# Patient Record
Sex: Female | Born: 1955
Health system: Southern US, Community
[De-identification: ages and names within clinical notes are randomized; demographics above are authoritative.]

## PROBLEM LIST (undated history)

## (undated) DIAGNOSIS — Z8619 Personal history of other infectious and parasitic diseases: Secondary | ICD-10-CM

## (undated) DIAGNOSIS — M8580 Other specified disorders of bone density and structure, unspecified site: Secondary | ICD-10-CM

## (undated) DIAGNOSIS — R05 Cough: Secondary | ICD-10-CM

## (undated) DIAGNOSIS — I499 Cardiac arrhythmia, unspecified: Secondary | ICD-10-CM

## (undated) DIAGNOSIS — R053 Chronic cough: Secondary | ICD-10-CM

## (undated) DIAGNOSIS — R0602 Shortness of breath: Secondary | ICD-10-CM

## (undated) DIAGNOSIS — I10 Essential (primary) hypertension: Secondary | ICD-10-CM

## (undated) DIAGNOSIS — Z1211 Encounter for screening for malignant neoplasm of colon: Secondary | ICD-10-CM

## (undated) HISTORY — DX: Cough: R05

## (undated) HISTORY — PX: OOPHORECTOMY: SHX86

## (undated) HISTORY — DX: Other specified disorders of bone density and structure, unspecified site: M85.80

## (undated) HISTORY — DX: Shortness of breath: R06.02

## (undated) HISTORY — DX: Personal history of other infectious and parasitic diseases: Z86.19

## (undated) HISTORY — DX: Essential (primary) hypertension: I10

## (undated) HISTORY — DX: Cardiac arrhythmia, unspecified: I49.9

## (undated) HISTORY — PX: FOOT NEUROMA SURGERY: SHX646

## (undated) HISTORY — PX: CO2 LASER APPLICATION: SHX5778

## (undated) HISTORY — PX: TUBAL LIGATION: SHX77

## (undated) HISTORY — DX: Chronic cough: R05.3

## (undated) HISTORY — DX: Encounter for screening for malignant neoplasm of colon: Z12.11

## (undated) HISTORY — PX: COLPOSCOPY: SHX161

---
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
## 1981-05-11 HISTORY — PX: OTHER SURGICAL HISTORY: SHX169

## 1991-05-12 HISTORY — PX: BREAST BIOPSY: SHX20

## 2002-05-11 HISTORY — PX: CHOLECYSTECTOMY: SHX55

## 2010-06-06 ENCOUNTER — Ambulatory Visit
Admission: RE | Admit: 2010-06-06 | Discharge: 2010-06-06 | Payer: Self-pay | Source: Home / Self Care | Attending: Family Medicine | Admitting: Family Medicine

## 2010-06-06 ENCOUNTER — Other Ambulatory Visit: Payer: Self-pay | Admitting: Family Medicine

## 2010-06-06 ENCOUNTER — Encounter (INDEPENDENT_AMBULATORY_CARE_PROVIDER_SITE_OTHER): Payer: Self-pay | Admitting: *Deleted

## 2010-06-06 DIAGNOSIS — Z1239 Encounter for other screening for malignant neoplasm of breast: Secondary | ICD-10-CM

## 2010-06-06 DIAGNOSIS — I499 Cardiac arrhythmia, unspecified: Secondary | ICD-10-CM | POA: Insufficient documentation

## 2010-06-06 DIAGNOSIS — I10 Essential (primary) hypertension: Secondary | ICD-10-CM | POA: Insufficient documentation

## 2010-06-12 ENCOUNTER — Ambulatory Visit
Admission: RE | Admit: 2010-06-12 | Discharge: 2010-06-12 | Disposition: A | Discharge status: Home / Self Care | Payer: Self-pay | Source: Ambulatory Visit | Attending: Family Medicine | Admitting: Family Medicine

## 2010-06-12 DIAGNOSIS — Z1239 Encounter for other screening for malignant neoplasm of breast: Secondary | ICD-10-CM

## 2010-06-12 NOTE — Letter (Signed)
Summary: Pre Visit Letter Revised  [facility]  [address] [place], [place] [address]   Phone: [phone]  Fax: [phone]        06/06/2010 MRN: [id number] Pamela Reynolds [address] [place], [place]  [address]             Procedure Date:  July 11, 2010   dir col-Dr Justin Mend to the Gastroenterology Division at [facility].    You are scheduled to see a nurse for your pre-procedure visit on June 27, 2010 at 11:00am on the 3rd floor at [facility], [address]  We ask that you try to arrive at our office 15 minutes prior to your appointment time to allow for check-in.  Please take a minute to review the attached form.  If you answer "Yes" to one or more of the questions on the first page, we ask that you call the person listed at your earliest opportunity.  If you answer "No" to all of the questions, please complete the rest of the form and bring it to your appointment.    Your nurse visit will consist of discussing your medical and surgical history, your immediate family medical history, and your medications.   If you are unable to list all of your medications on the form, please bring the medication bottles to your appointment and we will list them.  We will need to be aware of both prescribed and over the counter drugs.  We will need to know exact dosage information as well.    Please be prepared to read and sign documents such as consent forms, a financial agreement, and acknowledgement forms.  If necessary, and with your consent, a friend or relative is welcome to sit-in on the nurse visit with you.  Please bring your insurance card so that we may make a copy of it.  If your insurance requires a referral to see a specialist, please bring your referral form from your primary care physician.  No co-pay is required for this nurse visit.     If you cannot keep your appointment, please call [phone] to cancel or reschedule prior to your  appointment date.  This allows [place] the opportunity to schedule an appointment for another patient in need of care.    Thank you for choosing [facility] for your medical needs.  We appreciate the opportunity to care for you.  Please visit [place] at our website  to learn more about our practice.  Sincerely, The Gastroenterology Division

## 2010-06-12 NOTE — Assessment & Plan Note (Signed)
Summary: NEW PT TO BE ESTABLISHED/JRR   Vital Signs:  Patient profile:   55 year old female Height:      70 inches Weight:      173.75 pounds BMI:     25.02 Temp:     98.3 degrees F oral Pulse rate:   68 / minute Pulse rhythm:   regular BP sitting:   176 / 90  (left arm) Cuff size:   regular  Vitals Entered By: Delilah Shan CMA Bich Mchaney Dull) (June 06, 2010 9:49 AM) CC: New Patient to Establish   History of Present Illness: NP.  Needs to est care.   Hypertension:      Using medication without problems or lightheadedness: yes Chest pain with exertion:no Edema:no Short of breath:occ, brief, at rest, not with exertion, long standing Other issues: no  Preventive Screening-Counseling & Management  Alcohol-Tobacco     Smoking Status: never  Caffeine-Diet-Exercise     Does Patient Exercise: no      Drug Use:  no.    Allergies (verified): No Known Drug Allergies  Past History:  Past Medical History: CHICKENPOX, HX OF (ICD-V15.9) CARDIAC ARRHYTHMIA (ICD-427.9) in distant past, s/p work up, was told this was benign HYPERTENSION (ICD-401.9) SCREENING, COLON CANCER (ICD-V76.51) OTHER SCREENING MAMMOGRAM (ICD-V76.12) H/o persistent cough with URI, prev tx'd with hycodan  Past Surgical History: 1973  Ovarian Cysts  1973  Appendectomy 1979   and 1980  - Childbirth, vaginal delivery, no complications 1993   Breast Biopsy 2002   Gallbladder 1983   BTL 2004   Cholecystectomy H/o R foot surgery for Morton's neuroma  Family History: Reviewed history and no changes required. Family History of Arthritis, grandparents Family History Hypertension, parents, grandparents Family History of Prostate CA, grandparents Family History of Heart Disease, grandparents F dead, MI at 39 M alive, healthy, HTN in 78s  Social History: Reviewed history and no changes required. Occupation:  Armed forces operational officer, in [employer] Education:  [employer] Never Smoked Alcohol use-yes, very  minimal (husband with h/o etoh abuse) Drug use-no Regular exercise-no From [place] Married 2000, second marriage.  2 grown children, 2 grandkidsOccupation:  employed Smoking Status:  never Drug Use:  no Does Patient Exercise:  no  Review of Systems       See HPI.  Otherwise negative.    Physical Exam  General:  GEN: nad, alert and oriented HEENT: mucous membranes moist NECK: supple w/o LA CV: rrr.  no murmur PULM: ctab, no inc wob ABD: soft, +bs EXT: no edema SKIN: no acute rash, likley lipoma noted on R posterior shoulder, not tender to palpation    Impression & Recommendations:  Problem # 1:  HYPERTENSION (ICD-401.9) No change in meds.  Return for labs and she'll check BP at home.  Unremarkable exam and okay for outpatient follow up. She agrees.  Her updated medication list for this problem includes:    Lisinopril 10 Mg Tabs (Lisinopril) .Marland Kitchen... Take 1 tablet by mouth once a day    Metoprolol Tartrate 50 Mg Tabs (Metoprolol tartrate) .Marland Kitchen... Take 1 tablet by mouth two times a day    Hydrochlorothiazide 25 Mg Tabs (Hydrochlorothiazide) .Marland Kitchen... Take 1 tablet by mouth once a day  Problem # 2:  OTHER SCREENING MAMMOGRAM (ICD-V76.12) Refer.  due.   Return for pap.  Orders: Radiology Referral (Radiology)  Problem # 3:  SCREENING, COLON CANCER (ICD-V76.51) refer, due.  D/w patient along with mammogram .  Orders: Gastroenterology Referral (GI)  Complete Medication List: 1)  Lisinopril 10 Mg Tabs (Lisinopril) .... Take 1 tablet by mouth once a day 2)  Metoprolol Tartrate 50 Mg Tabs (Metoprolol tartrate) .... Take 1 tablet by mouth two times a day 3)  Hydrochlorothiazide 25 Mg Tabs (Hydrochlorothiazide) .... Take 1 tablet by mouth once a day  Patient Instructions: 1)  Glad to see you today.  2)  Come back for fasting labs.  cmet/lipid 401.1 3)  Check your pressure at home and let me know about the readings.  You can calibrate your cuff here if needed. 4)  See Shirlee Limerick about your  referral before your leave today.   5)  Schedule a OV in about 1 month for pap.  6)  Take care.     Orders Added: 1)  New Patient Level III [99203] 2)  Gastroenterology Referral [GI] 3)  Radiology Referral [Radiology]    Current Allergies (reviewed today): No known allergies

## 2010-06-13 ENCOUNTER — Ambulatory Visit: Payer: 59

## 2010-06-13 ENCOUNTER — Encounter: Payer: Self-pay | Admitting: Family Medicine

## 2010-06-13 ENCOUNTER — Encounter (INDEPENDENT_AMBULATORY_CARE_PROVIDER_SITE_OTHER): Payer: Self-pay | Admitting: *Deleted

## 2010-06-13 ENCOUNTER — Other Ambulatory Visit (INDEPENDENT_AMBULATORY_CARE_PROVIDER_SITE_OTHER): Payer: 59

## 2010-06-13 ENCOUNTER — Other Ambulatory Visit: Payer: Self-pay | Admitting: Family Medicine

## 2010-06-13 DIAGNOSIS — I10 Essential (primary) hypertension: Secondary | ICD-10-CM

## 2010-06-13 LAB — LIPID PANEL
LDL Cholesterol: 119 mg/dL — ABNORMAL HIGH (ref 0–99)
Total CHOL/HDL Ratio: 4
Triglycerides: 61 mg/dL (ref 0.0–149.0)

## 2010-06-13 LAB — HEPATIC FUNCTION PANEL
ALT: 16 U/L (ref 0–35)
AST: 23 U/L (ref 0–37)
Bilirubin, Direct: 0.1 mg/dL (ref 0.0–0.3)
Total Protein: 6.6 g/dL (ref 6.0–8.3)

## 2010-06-13 LAB — BASIC METABOLIC PANEL
CO2: 31 mEq/L (ref 19–32)
Chloride: 102 mEq/L (ref 96–112)
Creatinine, Ser: 0.7 mg/dL (ref 0.4–1.2)
Potassium: 3.4 mEq/L — ABNORMAL LOW (ref 3.5–5.1)

## 2010-06-17 ENCOUNTER — Encounter (INDEPENDENT_AMBULATORY_CARE_PROVIDER_SITE_OTHER): Payer: Self-pay | Admitting: *Deleted

## 2010-06-18 NOTE — Assessment & Plan Note (Signed)
Summary: Blood pressure check  Nurse Visit   Vital Signs:  Patient profile:   55 year old female Height:      70 inches Weight:      173.75 pounds Pulse rate:   60 / minute Pulse rhythm:   regular BP sitting:   170 / 90  (left arm) Cuff size:   regular  Vitals Entered By: Linde Gillis CMA Deward Sebek Dull) (June 13, 2010 10:25 AM) CC: blood pressure check  190/103 on her automatic cuff 155/88 Tuesday 161/86 Wednesday AM, 192/83 PM 187/98 Thursday 169/92 Friday  Patient's blood pressure medication has not changed and she does not have a follow up appt.  Please call her on her cell number if any changes at [phone].  Linde Gillis CMA Jamarques Pinedo Dull)  June 13, 2010 10:35 AM   Please have the patient increase her lisinopril to 2 tabs a day and then keep checking her BP once a day.  Call me with an update next week on BP.  Note that it will take a days for the higher dose of the lisinopril to work.  I'll notify her about the labs when they are resulted.  Thanks. Wwe can determine follow up after I get the next BP readings.   Patient Advised. Lugene Fuquay CMA (AAMA)  June 13, 2010 12:15 PM        Allergies: No Known Drug Allergies

## 2010-06-20 ENCOUNTER — Encounter: Payer: Self-pay | Admitting: Family Medicine

## 2010-06-20 ENCOUNTER — Telehealth: Payer: Self-pay | Admitting: Family Medicine

## 2010-06-20 ENCOUNTER — Ambulatory Visit: Payer: 59

## 2010-06-20 DIAGNOSIS — I10 Essential (primary) hypertension: Secondary | ICD-10-CM

## 2010-06-26 ENCOUNTER — Encounter (INDEPENDENT_AMBULATORY_CARE_PROVIDER_SITE_OTHER): Payer: Self-pay | Admitting: *Deleted

## 2010-06-26 NOTE — Letter (Signed)
Summary: Results Follow up Letter  [place] [facility]  [address] [place], [place] [address]   Phone: [phone]  Fax: [phone]    06/17/2010 MRN: [id number]    Pamela Reynolds [address] [place]  [address]  [place]    Dear [name],  The following are the results of your recent test(s):  Test         Result    Pap Smear:        Normal _____  Not Normal _____ Comments: ______________________________________________________ Cholesterol: LDL(Bad cholesterol):         Your goal is less than:         HDL (Good cholesterol):       Your goal is more than: Comments:  ______________________________________________________ Mammogram:        Normal __X___  Not Normal _____ Comments:  Yearly follow up is recommended.   ___________________________________________________________________ Hemoccult:        Normal _____  Not normal _______ Comments:    _____________________________________________________________________ Other Tests:    We routinely do not discuss normal results over the telephone.  If you desire a copy of the results, or you have any questions about this information we can discuss them at your next office visit.   Sincerely,    Dwana Curd. Para March, M.D.  [facility]

## 2010-06-26 NOTE — Assessment & Plan Note (Signed)
Summary: BP check  Nurse Visit   Vital Signs:  Patient profile:   55 year old female Height:      70 inches Weight:      173.75 pounds BMI:     25.02 BP sitting:   188 / 102  (left arm) Cuff size:   regular  Vitals Entered By: Delilah Shan CMA Shalik Sanfilippo Dull) (June 20, 2010 12:43 PM) CC: BP check   Allergies: No Known Drug Allergies  Current Allergies (reviewed today): No known allergies  Patient says she has been getting similar readings at home with her machine.  She did increase the Lisinopril to 2 tablets a day and has developed a cough, worse at night.  She is not sure if it is related to the increased Lisinopril.  Please see phone note.  See following note.  Crawford Givens MD  June 20, 2010 1:02 PM

## 2010-06-26 NOTE — Progress Notes (Signed)
Summary: ? cough med  Phone Note Call from Patient Call back at Home Phone [phone]   Caller: Patient Call For: Crawford Givens MD Summary of Call: Patient was in for a BP check.  She thinks she is starting to take the URI stuff that so many folks have right now.  She says that you have the name of the cough med that she has taken in the past in her chart and she would like that called in to her pharmacy.  [facility]  [phone]  Initial call taken by: Delilah Shan CMA Marx Doig Dull),  June 20, 2010 12:48 PM  Follow-up for Phone Call        have her continue the lisinopril at her current dose and start amlodipine 10mg  by mouth once daily.  Keep checking her BP and get her on my schedule for an OV Monday or Tuesday.   please call in the following: amlodipine 10mg  by mouth once daily for blood pressure, #30, 3rf and HYDROCODONE-HOMATROPINE 5-1.5 MG/5ML SYRP, 5 ml by mouth qid as needed for cough with sedation caution.  8oz, no rf.  thanks.  her BP is up, but this is not sig different than prev and as long as she has no CP/sob, then it's okay to continue with the plan above.  If CP/sob, to ER.  Follow-up by: Crawford Givens MD,  June 20, 2010 1:07 PM  Additional Follow-up for Phone Call Additional follow up Details #1::        Patient Advised.  Medication phoned to pharmacy.  Additional Follow-up by: Delilah Shan CMA (AAMA),  June 20, 2010 2:38 PM    New/Updated Medications: AMLODIPINE BESYLATE 10 MG TABS (AMLODIPINE BESYLATE) 1 by mouth once daily HYDROCODONE-HOMATROPINE 5-1.5 MG/5ML SYRP (HYDROCODONE-HOMATROPINE) 5ml by mouth qid as needed for cough, sedation caution Prescriptions: HYDROCODONE-HOMATROPINE 5-1.5 MG/5ML SYRP (HYDROCODONE-HOMATROPINE) 5ml by mouth qid as needed for cough, sedation caution  #8oz x 0   Entered and Authorized by:   Crawford Givens MD   Signed by:   Crawford Givens MD on 06/20/2010   Method used:   Historical   RxID:   [id number] AMLODIPINE BESYLATE  10 MG TABS (AMLODIPINE BESYLATE) 1 by mouth once daily  #30 x 3   Entered and Authorized by:   Crawford Givens MD   Signed by:   Crawford Givens MD on 06/20/2010   Method used:   Historical   RxID:   [id number]

## 2010-06-27 ENCOUNTER — Encounter: Payer: Self-pay | Admitting: Internal Medicine

## 2010-06-27 ENCOUNTER — Ambulatory Visit (INDEPENDENT_AMBULATORY_CARE_PROVIDER_SITE_OTHER): Payer: 59 | Admitting: Family Medicine

## 2010-06-27 ENCOUNTER — Encounter: Payer: Self-pay | Admitting: Family Medicine

## 2010-06-27 DIAGNOSIS — I10 Essential (primary) hypertension: Secondary | ICD-10-CM

## 2010-07-02 ENCOUNTER — Telehealth: Payer: Self-pay | Admitting: Family Medicine

## 2010-07-02 NOTE — Miscellaneous (Signed)
Summary: lec pv/kco  Clinical Lists Changes  Medications: Added new medication of MOVIPREP 100 GM  SOLR (PEG-KCL-NACL-NASULF-NA ASC-C) As per prep instructions. - Signed Rx of MOVIPREP 100 GM  SOLR (PEG-KCL-NACL-NASULF-NA ASC-C) As per prep instructions.;  #1 x 0;  Signed;  Entered by: Durwin Glaze RN;  Authorized by: Hilarie Fredrickson MD;  Method used: Print then Give to Patient Observations: Added new observation of NKA: T (06/27/2010 9:16)    Prescriptions: MOVIPREP 100 GM  SOLR (PEG-KCL-NACL-NASULF-NA ASC-C) As per prep instructions.  #1 x 0   Entered by:   Durwin Glaze RN   Authorized by:   Hilarie Fredrickson MD   Signed by:   Durwin Glaze RN on 06/27/2010   Method used:   Print then Give to Patient   RxID:   [id number]

## 2010-07-02 NOTE — Assessment & Plan Note (Signed)
Summary: BP check   Vital Signs:  Patient profile:   55 year old female Height:      70 inches Weight:      171 pounds BMI:     24.62 Temp:     97.9 degrees F oral Pulse rate:   80 / minute Pulse rhythm:   regular BP sitting:   152 / 88  (left arm) Cuff size:   regular  Vitals Entered By: Delilah Shan CMA Sharetta Ricchio Dull) (June 27, 2010 10:52 AM) CC: BP check   History of Present Illness: BP went down when meds were increased.  Usually 130-140s/80s at home.  Edema/rash started with the amlodipine addition.  She has a cough at night.  Hycodan helps.  Cough increased when the lisinopril went up.  No CP/sob.  Now with trace bilateral lower extremity that is symmetric.   Allergies (verified): 1)  ! Amlodipine Besylate 2)  ! * Lisinopril  Review of Systems       See HPI.  Otherwise negative.    Physical Exam  General:  GEN: nad, alert and oriented HEENT: mucous membranes moist NECK: supple w/o LA CV: rrr.  no murmur PULM: ctab, no inc wob ABD: soft, +bs EXT: no edema SKIN: trace bilateral lower extremity edema with blanching red macules noted on the lower shins   Impression & Recommendations:  Problem # 1:  HYPERTENSION (ICD-401.9) Stop the amlodipine and then transition from ACE to ARB.  She'll call back with update and we'll likely have to increase the ARB at that point.  I didn't want to increase the BB as her HR had prev been in the 60s.  She agrees.  Nontoxic and okay for outpatient follow up.   The following medications were removed from the medication list:    Lisinopril 10 Mg Tabs (Lisinopril) .Marland Kitchen... Take 2  tablets  by mouth once a day    Amlodipine Besylate 10 Mg Tabs (Amlodipine besylate) .Marland Kitchen... 1 by mouth once daily Her updated medication list for this problem includes:    Metoprolol Tartrate 50 Mg Tabs (Metoprolol tartrate) .Marland Kitchen... Take 1 tablet by mouth two times a day    Hydrochlorothiazide 25 Mg Tabs (Hydrochlorothiazide) .Marland Kitchen... Take 1 tablet by mouth once a day   Losartan Potassium 50 Mg Tabs (Losartan potassium) .Marland Kitchen... 1 by mouth once daily when you finish the lisinopril  Orders: Prescription Created Electronically [id number])  Complete Medication List: 1)  Metoprolol Tartrate 50 Mg Tabs (Metoprolol tartrate) .... Take 1 tablet by mouth two times a day 2)  Hydrochlorothiazide 25 Mg Tabs (Hydrochlorothiazide) .... Take 1 tablet by mouth once a day 3)  Hydrocodone-homatropine 5-1.5 Mg/83ml Syrp (Hydrocodone-homatropine) .... 5ml by mouth qid as needed for cough, sedation caution 4)  Moviprep 100 Gm Solr (Peg-kcl-nacl-nasulf-na asc-c) .... As per prep instructions. 5)  Losartan Potassium 50 Mg Tabs (Losartan potassium) .Marland Kitchen.. 1 by mouth once daily when you finish the lisinopril  Patient Instructions: 1)  Stop the amlodipine and see if the rash/edema improved. 2)  Finish the lisinopril and then start the losartan.  See if the cough improves. 3)  Continue the HCTZ and the metoprolol. 4)  Call me with an update next week.  Prescriptions: LOSARTAN POTASSIUM 50 MG TABS (LOSARTAN POTASSIUM) 1 by mouth once daily when you finish the lisinopril  #30 x 5   Entered and Authorized by:   Crawford Givens MD   Signed by:   Crawford Givens MD on 06/27/2010   Method used:   Arneta Cliche  to ...       [facility] (retail)       [address]       [address]       [place], [place]  [address]  [place]       Ph: [phone]       Fax: [phone]   RxID:   [id number] HYDROCHLOROTHIAZIDE 25 MG TABS (HYDROCHLOROTHIAZIDE) Take 1 tablet by mouth once a day  #90 x 3   Entered and Authorized by:   Crawford Givens MD   Signed by:   Crawford Givens MD on 06/27/2010   Method used:   Faxed to ...       [facility] (retail)       [address]       [address]       [place], [place]  [address]  [place]       Ph: [phone]       Fax: [phone]   RxID:   [id number] LOSARTAN POTASSIUM 50 MG TABS (LOSARTAN POTASSIUM) 1 by mouth once daily when you finish the lisinopril  #30 x 5    Entered and Authorized by:   Crawford Givens MD   Signed by:   Crawford Givens MD on 06/27/2010   Method used:   Print then Give to Patient   RxID:   [id number]    Orders Added: 1)  Prescription Created Electronically [G8553] 2)  Est. Patient Level III [32355]    Current Allergies (reviewed today): ! AMLODIPINE BESYLATE ! * LISINOPRIL

## 2010-07-02 NOTE — Letter (Signed)
Summary: Moviprep Instructions  [facility]  [address]   [place], [place] [address]   Phone: [phone]  Fax: [phone]       Pamela Reynolds    18-Sep-1955    MRN: [id number]        Procedure Day Dorna Bloom: Friday, 07-11-10     Arrival Time: 10:30 a.m.     Procedure Time: 11:30 a.m.     Location of Procedure:                     x  [facility] (4th Floor)                        PREPARATION FOR COLONOSCOPY WITH MOVIPREP   Starting 5 days prior to your procedure 07-06-10 do not eat nuts, seeds, popcorn, corn, beans, peas,  salads, or any raw vegetables.  Do not take any fiber supplements (e.g. Metamucil, Citrucel, and Benefiber).  THE DAY BEFORE YOUR PROCEDURE         DATE: 07-10-10 DAY: Thursday  1.  Drink clear liquids the entire day-NO SOLID FOOD  2.  Do not drink anything colored red or purple.  Avoid juices with pulp.  No orange juice.  3.  Drink at least 64 oz. (8 glasses) of fluid/clear liquids during the day to prevent dehydration and help the prep work efficiently.  CLEAR LIQUIDS INCLUDE: Water Jello Ice Popsicles Tea (sugar ok, no milk/cream) Powdered fruit flavored drinks Coffee (sugar ok, no milk/cream) Gatorade Juice: apple, white grape, white cranberry  Lemonade Clear bullion, consomm, broth Carbonated beverages (any kind) Strained chicken noodle soup Hard Candy                             4.  In the morning, mix first dose of MoviPrep solution:    Empty 1 Pouch A and 1 Pouch B into the disposable container    Add lukewarm drinking water to the top line of the container. Mix to dissolve    Refrigerate (mixed solution should be used within 24 hrs)  5.  Begin drinking the prep at 5:00 p.m. The MoviPrep container is divided by 4 marks.   Every 15 minutes drink the solution down to the next mark (approximately 8 oz) until the full liter is complete.   6.  Follow completed prep with 16 oz of clear liquid of your choice (Nothing  red or purple).  Continue to drink clear liquids until bedtime.  7.  Before going to bed, mix second dose of MoviPrep solution:    Empty 1 Pouch A and 1 Pouch B into the disposable container    Add lukewarm drinking water to the top line of the container. Mix to dissolve    Refrigerate  THE DAY OF YOUR PROCEDURE      DATE: 07-11-10  DAY: Friday  Beginning at 6:30 a.m. (5 hours before procedure):         1. Every 15 minutes, drink the solution down to the next mark (approx 8 oz) until the full liter is complete.  2. Follow completed prep with 16 oz. of clear liquid of your choice.    3. You may drink clear liquids until 9:30 a.m. (2 HOURS BEFORE PROCEDURE).   MEDICATION INSTRUCTIONS  Unless otherwise instructed, you should take regular prescription medications with a small sip of water   as early as possible the morning of  your procedure.         OTHER INSTRUCTIONS  You will need a responsible adult at least 57 years of age to accompany you and drive you home.   This person must remain in the waiting room during your procedure.  Wear loose fitting clothing that is easily removed.  Leave jewelry and other valuables at home.  However, you may wish to bring a book to read or  an iPod/MP3 player to listen to music as you wait for your procedure to start.  Remove all body piercing jewelry and leave at home.  Total time from sign-in until discharge is approximately 2-3 hours.  You should go home directly after your procedure and rest.  You can resume normal activities the  day after your procedure.  The day of your procedure you should not:   Drive   Make legal decisions   Operate machinery   Drink alcohol   Return to work  You will receive specific instructions about eating, activities and medications before you leave.    The above instructions have been reviewed and explained to me by   Durwin Glaze RN  June 27, 2010 9:31 AM     I fully understand and  can verbalize these instructions _____________________________ Date _________

## 2010-07-04 ENCOUNTER — Ambulatory Visit: Payer: 59

## 2010-07-04 ENCOUNTER — Ambulatory Visit: Payer: Self-pay

## 2010-07-04 ENCOUNTER — Other Ambulatory Visit (INDEPENDENT_AMBULATORY_CARE_PROVIDER_SITE_OTHER): Payer: 59

## 2010-07-04 ENCOUNTER — Encounter (INDEPENDENT_AMBULATORY_CARE_PROVIDER_SITE_OTHER): Payer: Self-pay | Admitting: *Deleted

## 2010-07-04 ENCOUNTER — Other Ambulatory Visit: Payer: Self-pay | Admitting: Family Medicine

## 2010-07-04 ENCOUNTER — Encounter: Payer: Self-pay | Admitting: Family Medicine

## 2010-07-04 DIAGNOSIS — I10 Essential (primary) hypertension: Secondary | ICD-10-CM

## 2010-07-04 LAB — BASIC METABOLIC PANEL
CO2: 32 mEq/L (ref 19–32)
Chloride: 103 mEq/L (ref 96–112)
Potassium: 3.4 mEq/L — ABNORMAL LOW (ref 3.5–5.1)
Sodium: 143 mEq/L (ref 135–145)

## 2010-07-07 ENCOUNTER — Ambulatory Visit: Payer: Self-pay | Admitting: Family Medicine

## 2010-07-08 NOTE — Progress Notes (Signed)
Summary: pt called with report  Phone Note Call from Patient Call back at Work Phone [phone]   Caller: Patient Call For: Crawford Givens MD Summary of Call: Pt called to report that swelling in ankles has gone down, cough is some better.  BP was 182/102 this morning- this could have been from some stress.  She said this has been running 140's/80's.  She feels very tired, no energy, some nausea, very little appetite.   She is taking her meds as directed.  Please advise. Initial call taken by: Lowella Petties CMA, AAMA,  July 02, 2010 10:24 AM  Follow-up for Phone Call        I called patient.  Rash is better as is edema.  BP was 142/80 yesterday and pulse has been in 60s.  The BP this AM was after she got off the phone with her mother and it was a tense conversation per her report.  I wouldn't change meds for now.  She'll call back about a nurse visit on Friday to get a BP check and get a nonfasting bmet at the same time.  dx 401.1.   Follow-up by: Crawford Givens MD,  July 02, 2010 1:29 PM    New/Updated Medications: LOSARTAN POTASSIUM 50 MG TABS (LOSARTAN POTASSIUM) 1 by mouth once daily

## 2010-07-11 ENCOUNTER — Other Ambulatory Visit (AMBULATORY_SURGERY_CENTER): Payer: 59 | Admitting: Internal Medicine

## 2010-07-11 ENCOUNTER — Other Ambulatory Visit: Payer: Self-pay | Admitting: Internal Medicine

## 2010-07-11 DIAGNOSIS — D126 Benign neoplasm of colon, unspecified: Secondary | ICD-10-CM

## 2010-07-11 DIAGNOSIS — Z1211 Encounter for screening for malignant neoplasm of colon: Secondary | ICD-10-CM

## 2010-07-11 LAB — HM COLONOSCOPY

## 2010-07-15 ENCOUNTER — Encounter: Payer: Self-pay | Admitting: Internal Medicine

## 2010-07-17 NOTE — Assessment & Plan Note (Signed)
Summary: blood pressure check/alc   Vital Signs:  Patient profile:   55 year old female Height:      70 inches Weight:      171 pounds BMI:     24.62 Pulse rate:   70 / minute Pulse rhythm:   regular BP sitting:   160 / 98  (right arm) Cuff size:   regular  Vitals Entered By: Linde Gillis CMA Inaki Vantine Dull) (July 04, 2010 10:12 AM) CC: blood pressure check  Patient states that Dr. Para March switched her from Lisinopril to Losartan because she developed a cough when he increased the dose of Lisinopril.  She stated that her ankle swelling has gone way down, the rash on her leg is clearing.  Wanted to know if she should be taking an Aspirin daily.  Please advise.  Uses [facility].  Patient was sent home and advised that we will call her with further instructions.  Linde Gillis CMA Cashmere Harmes Dull)  July 04, 2010 10:25 AM    Please advise patient that I wanted to see the results of the bmet.  I would increase the losartan to 2 a day (100mg  total) and then call back with her BP readings.  I would take 81mg  of ASA a day.  Her labs are fine.   Thanks.  Crawford Givens MD  July 05, 2010 2:17 PM   Left message on voicemail  in detail.  Personalized VM. Lugene Fuquay CMA (AAMA)  July 07, 2010 11:31 AM    Allergies: 1)  ! Amlodipine Besylate 2)  ! * Lisinopril   Complete Medication List: 1)  Metoprolol Tartrate 50 Mg Tabs (Metoprolol tartrate) .... Take 1 tablet by mouth two times a day 2)  Hydrochlorothiazide 25 Mg Tabs (Hydrochlorothiazide) .... Take 1 tablet by mouth once a day 3)  Hydrocodone-homatropine 5-1.5 Mg/62ml Syrp (Hydrocodone-homatropine) .... 5ml by mouth qid as needed for cough, sedation caution 4)  Moviprep 100 Gm Solr (Peg-kcl-nacl-nasulf-na asc-c) .... As per prep instructions. 5)  Losartan Potassium 50 Mg Tabs (Losartan potassium) .Marland Kitchen.. 1 by mouth once daily

## 2010-07-17 NOTE — Procedures (Addendum)
Summary: Colonoscopy  Patient: [name] Note: All result statuses are Final unless otherwise noted.  Tests: (1) Colonoscopy (COL)   COL Colonoscopy           DONE     [facility]     [address]     [place], [place]  [address]          COLONOSCOPY PROCEDURE REPORT          PATIENT:  Pamela Reynolds, Pamela Reynolds  MR#:  [id number]     BIRTHDATE:  Aug 06, 1955, 54 yrs. old  GENDER:  female     ENDOSCOPIST:  Wilhemina Bonito. Eda Keys, MD     REF. BY:  Crawford Givens, M.D.     PROCEDURE DATE:  07/11/2010     PROCEDURE:  Colonoscopy with snare polypectomy x 1     ASA CLASS:  Class II     INDICATIONS:  Routine Risk Screening     MEDICATIONS:   Fentanyl 75 mcg IV, Versed 10 mg IV          DESCRIPTION OF PROCEDURE:   After the risks benefits and     alternatives of the procedure were thoroughly explained, informed     consent was obtained.  Digital rectal exam was performed and     revealed no abnormalities.   The LB 180AL [id number] endoscope was     introduced through the anus and advanced to the cecum, which was     identified by both the appendix and ileocecal valve, without     limitations.Time to cecum = 6:30 min. The quality of the prep was     excellent, using MoviPrep.  The instrument was then slowly     withdrawn (time = 12:19 min) as the colon was fully examined.     <<PROCEDUREIMAGES>>          FINDINGS:  A diminutive polyp was found at the hepatic flexure.     Polyp was snared without cautery. Retrieval was successful.     Otherwise normal colonoscopy without other polyps, masses, vascular     ectasias, or inflammatory changes.   Retroflexed views in the     rectum revealed no abnormalities.    The scope was then withdrawn     from the patient and the procedure completed.          COMPLICATIONS:  None          ENDOSCOPIC IMPRESSION:     1) Diminutive polyp at the hepatic flexure - removed     2) Otherwise normal colonoscopy          RECOMMENDATIONS:     1) Repeat colonoscopy  in 5 years if polyp adenomatous; otherwise     10 years          ______________________________     Wilhemina Bonito. Eda Keys, MD          CC:  Crawford Givens, MD;  The Patient          n.     eSIGNED:   Wilhemina Bonito. Eda Keys at 07/11/2010 01:13 PM          Fons, Darl Pikes, [id number]  Note: An exclamation mark (!) indicates a result that was not dispersed into the flowsheet. Document Creation Date: 07/11/2010 1:13 PM _______________________________________________________________________  (1) Order result status: Final Collection or observation date-time: 07/11/2010 13:06 Requested date-time:  Receipt date-time:  Reported date-time:  Referring Physician:   Ordering Physician: Fransico Setters [phone]) Specimen  Source:  Source: Launa Grill Order Number: [id number] Lab site:   Appended Document: Colonoscopy recall 10 yrs     Procedures Next Due Date:    Colonoscopy: 07/2020

## 2010-07-18 ENCOUNTER — Encounter: Payer: Self-pay | Admitting: Family Medicine

## 2010-07-18 ENCOUNTER — Ambulatory Visit (INDEPENDENT_AMBULATORY_CARE_PROVIDER_SITE_OTHER): Payer: 59 | Admitting: Family Medicine

## 2010-07-18 ENCOUNTER — Other Ambulatory Visit (HOSPITAL_COMMUNITY)
Admission: RE | Admit: 2010-07-18 | Discharge: 2010-07-18 | Disposition: A | Discharge status: Home / Self Care | Payer: 59 | Source: Ambulatory Visit | Attending: Family Medicine | Admitting: Family Medicine

## 2010-07-18 ENCOUNTER — Other Ambulatory Visit: Payer: Self-pay | Admitting: Family Medicine

## 2010-07-18 DIAGNOSIS — I10 Essential (primary) hypertension: Secondary | ICD-10-CM

## 2010-07-18 DIAGNOSIS — Z124 Encounter for screening for malignant neoplasm of cervix: Secondary | ICD-10-CM

## 2010-07-18 DIAGNOSIS — Z1159 Encounter for screening for other viral diseases: Secondary | ICD-10-CM | POA: Insufficient documentation

## 2010-07-18 LAB — HM PAP SMEAR

## 2010-07-22 NOTE — Letter (Addendum)
Summary: Patient Notice- Polyp Results  [facility]  [address] [place], [place] [address]   Phone: [phone]  Fax: [phone]        July 15, 2010 MRN: [id number]    Pamela Reynolds [address] [place], [place]  [address]    Dear [name],  I am pleased to inform you that the colon polyp(s) removed during your recent colonoscopy was (were) found to be benign (no cancer detected) upon pathologic examination.  I recommend you have a repeat colonoscopy examination in 10 years to look for recurrent polyps, as having colon polyps increases your risk for having recurrent polyps or even colon cancer in the future.  Should you develop new or worsening symptoms of abdominal pain, bowel habit changes or bleeding from the rectum or bowels, please schedule an evaluation with either your primary care physician or with me.  Additional information/recommendations:  __ No further action with gastroenterology is needed at this time. Please      follow-up with your primary care physician for your other healthcare      needs.    Please call us if you are having persistent problems or have questions about your condition that have not been fully answered at this time.  Sincerely,  Hilarie Fredrickson MD  This letter has been electronically signed by your physician.  Appended Document: Patient Notice- Polyp Results letter mailed

## 2010-07-24 ENCOUNTER — Encounter (INDEPENDENT_AMBULATORY_CARE_PROVIDER_SITE_OTHER): Payer: Self-pay | Admitting: *Deleted

## 2010-07-29 NOTE — Letter (Signed)
Summary: Results Follow up Letter  [place]  [address] [place], [place] [address]   Phone: [phone]  Fax: [phone]    07/24/2010 MRN: [id number]    Moana Nicolosi [address] [place]  [address]  [place]    Dear Ms. Jobe,  The following are the results of your recent test(s):  Test         Result    Pap Smear:        Normal __X___  Not Normal _____ Comments:   Recheck in about 3 years. ______________________________________________________ Cholesterol: LDL(Bad cholesterol):         Your goal is less than:         HDL (Good cholesterol):       Your goal is more than: Comments:  ______________________________________________________ Mammogram:        Normal _____  Not Normal _____ Comments:  ___________________________________________________________________ Hemoccult:        Normal _____  Not normal _______ Comments:    _____________________________________________________________________ Other Tests:    We routinely do not discuss normal results over the telephone.  If you desire a copy of the results, or you have any questions about this information we can discuss them at your next office visit.   Sincerely,    Dwana Curd. Para March, M.D.  [facility]

## 2010-07-29 NOTE — Assessment & Plan Note (Signed)
Summary: 1 month for pap/rbh   Vital Signs:  Patient profile:   55 year old female Height:      70 inches Weight:      169.75 pounds BMI:     24.44 Temp:     98.6 degrees F oral Pulse rate:   68 / minute Pulse rhythm:   regular BP sitting:   146 / 98  (left arm) Cuff size:   regular  Vitals Entered By: Delilah Shan CMA Rusti Arizmendi Dull) (July 18, 2010 8:31 AM) CC: 1 month follow up / ? Pap   History of Present Illness: Hypertension:      Using medication without problems: yes Chest pain with exertion: no Edema:no Short of breath: can happen at rest, intermittent and irregular occurance Average home BPs: as today or higher.   Other issues: HA over the last few days.  she doesn't think she is grinding her teeth.  She has cut out salt.  Pulse has been in the 60s.   She felt better when her pressure was lower but she couldn't tolerate the amlodipine.  See prev notes.  no edema or rash since stopping the amlodine and ACE, respectively.    Due for Pap.  See exam. H/o abnormal pap 10+ years ago with prev cervical laser tx and normal paps in the meantime.   Allergies: 1)  ! Amlodipine Besylate 2)  ! * Lisinopril  Review of Systems       See HPI.  Otherwise negative.    Physical Exam  General:  GEN: nad, alert and oriented HEENT: mucous membranes moist NECK: supple w/o LA CV: rrr.  no murmur PULM: ctab, no inc wob ABD: soft, +bs EXT: no edema SKIN: no rash.  Genitalia:  Pelvic Exam:        External: normal female genitalia without lesions or masses        Vagina: normal without lesions or masses        Cervix: normal without acute lesions or masses, chronic irregularities likely related to prev treatment noted        Adnexa: normal bimanual exam without masses or fullness        Uterus: normal by palpation        Pap smear: performed   Impression & Recommendations:  Problem # 1:  HYPERTENSION (ICD-401.9) Cannot tolerate ACE or CCB.  Add on clonidine with caution for  rebound.  combine the ARB and HCTZ w/o dose change on either.  I would not increase the BB.  She should be able to tolerate this combination.  If she fails to have control with this, we may need to consider RAS eval or cards referral.  She is to continue low salt diet and exercise.  She agrees.  She'll check BP and notify the clinic.   The following medications were removed from the medication list:    Hydrochlorothiazide 25 Mg Tabs (Hydrochlorothiazide) .Marland Kitchen... Take 1 tablet by mouth once a day Her updated medication list for this problem includes:    Metoprolol Tartrate 50 Mg Tabs (Metoprolol tartrate) .Marland Kitchen... Take 1 tablet by mouth two times a day    Losartan Potassium-hctz 100-25 Mg Tabs (Losartan potassium-hctz) .Marland Kitchen... 1 by mouth once daily    Clonidine Hcl 0.1 Mg Tabs (Clonidine hcl) .Marland Kitchen... 1 by mouth two times a day  Orders: Prescription Created Electronically [phone])  Problem # 2:  SCREENING FOR MALIGNANT NEOPLASM OF THE CERVIX (ICD-V76.2) pap collected, normal bimanual exam.  see notes on labs  when resulted.   Complete Medication List: 1)  Metoprolol Tartrate 50 Mg Tabs (Metoprolol tartrate) .... Take 1 tablet by mouth two times a day 2)  Hydrocodone-homatropine 5-1.5 Mg/74ml Syrp (Hydrocodone-homatropine) .... 5ml by mouth qid as needed for cough, sedation caution 3)  Losartan Potassium-hctz 100-25 Mg Tabs (Losartan potassium-hctz) .Marland Kitchen.. 1 by mouth once daily 4)  Aspirin 81 Mg Tabs (Aspirin) .... Take 1 tablet by mouth once a day 5)  Clonidine Hcl 0.1 Mg Tabs (Clonidine hcl) .Marland Kitchen.. 1 by mouth two times a day  Patient Instructions: 1)  I combined the losartan and the hctz.  Take 1 a day.  The total daily dose didn't change.  2)  Don't change the metoprolol. 3)  Take clonidine 1 by mouth two times a day and check your BP next week.  Let me know how your numbers are running.  If you have concerns in the meantime, let us know. 4)  We'll contact you with your lab report.   Prescriptions: CLONIDINE HCL 0.1 MG TABS (CLONIDINE HCL) 1 by mouth two times a day  #180 x 3   Entered and Authorized by:   Crawford Givens MD   Signed by:   Crawford Givens MD on 07/18/2010   Method used:   Faxed to ...       [facility] (retail)       [address]       [address]       [place], [place]  [address]  [place]       Ph: [phone]       Fax: [phone]   RxID:   [id number] LOSARTAN POTASSIUM-HCTZ 100-25 MG TABS (LOSARTAN POTASSIUM-HCTZ) 1 by mouth once daily  #90 x 3   Entered and Authorized by:   Crawford Givens MD   Signed by:   Crawford Givens MD on 07/18/2010   Method used:   Faxed to ...       [facility] (retail)       [address]       [address]       [place], [place]  [address]  [place]       Ph: [phone]       Fax: [phone]   RxID:   [id number]    Orders Added: 1)  Est. Patient Level III [88416] 2)  Prescription Created Electronically [phone]    Prior Medications: METOPROLOL TARTRATE 50 MG TABS (METOPROLOL TARTRATE) Take 1 tablet by mouth two times a day HYDROCODONE-HOMATROPINE 5-1.5 MG/5ML SYRP (HYDROCODONE-HOMATROPINE) 5ml by mouth qid as needed for cough, sedation caution LOSARTAN POTASSIUM-HCTZ 100-25 MG TABS (LOSARTAN POTASSIUM-HCTZ) 1 by mouth once daily ASPIRIN 81 MG  TABS (ASPIRIN) Take 1 tablet by mouth once a day CLONIDINE HCL 0.1 MG TABS (CLONIDINE HCL) 1 by mouth two times a day Current Allergies: ! AMLODIPINE BESYLATE ! * LISINOPRIL

## 2010-08-07 ENCOUNTER — Telehealth: Payer: Self-pay | Admitting: *Deleted

## 2010-08-07 NOTE — Telephone Encounter (Signed)
I called Pt.  No CP or SOB.  She has had an inc in stress at home recently and this may have played a role in her BP elevation.  We talked about options.  If CP or SOB, then to ER.  She agrees.  In meantime, will increase clonidine to 0.2 mg (2 of the 0.1mg  tabs) twice a day.  If needed, she can back down to 0.1mg  bid.  She agrees and will come in to talk with me about anxiety/stress/BP next week.

## 2010-08-07 NOTE — Telephone Encounter (Signed)
Pt states her BP has gone back up to 190's/90's and her ankles are swollen- twice their normal size.  She is drinking plenty of fluids, voiding well, feeling well.  Asking what she should do.  She is asking if she needs to come in for office visit.

## 2010-08-08 ENCOUNTER — Encounter: Payer: Self-pay | Admitting: Family Medicine

## 2010-08-11 ENCOUNTER — Encounter: Payer: Self-pay | Admitting: Family Medicine

## 2010-08-11 ENCOUNTER — Ambulatory Visit (INDEPENDENT_AMBULATORY_CARE_PROVIDER_SITE_OTHER): Payer: 59 | Admitting: Family Medicine

## 2010-08-11 DIAGNOSIS — F411 Generalized anxiety disorder: Secondary | ICD-10-CM

## 2010-08-11 DIAGNOSIS — I10 Essential (primary) hypertension: Secondary | ICD-10-CM

## 2010-08-11 DIAGNOSIS — F419 Anxiety disorder, unspecified: Secondary | ICD-10-CM | POA: Insufficient documentation

## 2010-08-11 MED ORDER — CITALOPRAM HYDROBROMIDE 20 MG PO TABS: 20.0000 mg | ORAL_TABLET | Freq: Every day | ORAL | Status: DC

## 2010-08-11 MED ORDER — ALPRAZOLAM 0.25 MG PO TABS: 0.2500 mg | ORAL_TABLET | Freq: Three times a day (TID) | ORAL | Status: DC | PRN

## 2010-08-11 NOTE — Assessment & Plan Note (Signed)
Cont current meds.  See below.

## 2010-08-11 NOTE — Assessment & Plan Note (Signed)
>  25 min spent with patient, at least half of which was spent on counseling ZO:XWRU.  I think this is likely exacerbating her BP.  D/w pt about keeping the medicine for herself (husband in recovery), using BZD prn with sedation caution.  Take SSRI daily and report back in about 2 weeks, with typical onset of effect 4-6 weeks.  Pt understood.  Okay for outpatient fu.  She says she is safe at home.
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
## 2010-08-11 NOTE — Patient Instructions (Signed)
Start taking the citalopram daily and use the xanax prn.  Let me know if you have worsening of your mood or other concerns in the meantime.  Call me with an update in about 2 weeks.  The xanax can make you drowsy.  Take care.

## 2010-08-12 ENCOUNTER — Other Ambulatory Visit: Payer: Self-pay | Admitting: *Deleted

## 2010-08-12 MED ORDER — METOPROLOL TARTRATE 50 MG PO TABS: 50.0000 mg | ORAL_TABLET | Freq: Two times a day (BID) | ORAL | Status: DC

## 2010-08-26 ENCOUNTER — Telehealth: Payer: Self-pay | Admitting: *Deleted

## 2010-08-26 MED ORDER — CLONIDINE HCL 0.1 MG PO TABS: 0.1000 mg | ORAL_TABLET | Freq: Two times a day (BID) | ORAL | Status: DC

## 2010-08-26 NOTE — Telephone Encounter (Signed)
Pt states that her BP medicine is working well, her BP is 100/64 today with a pulse of 56, but she says something is wrong.  She says she has no energy, she feels dizzy and lightheaded.  She feels the urgent need to defacate in the mornings and goes 3-4 times every morning.  She thinks this is the medicine.  Please advise.

## 2010-08-26 NOTE — Telephone Encounter (Signed)
I called pt.  It sounds as though her anxiety is some better.  This may be helping lower the BP and she may have a vagal/hypotensive episode.  She has the sx after taking her AM BP meds.  I would dec the clonidine to 0.1mg  po bid (down from 2 tabs bid) and monitor sx.  She may have to cut the clonidine out totally.  She agrees with the plan.

## 2010-12-01 ENCOUNTER — Other Ambulatory Visit: Payer: Self-pay | Admitting: *Deleted

## 2010-12-01 MED ORDER — CITALOPRAM HYDROBROMIDE 20 MG PO TABS: 20.0000 mg | ORAL_TABLET | Freq: Every day | ORAL | Status: DC

## 2010-12-01 NOTE — Telephone Encounter (Signed)
Received faxed refill request from pharmacy. Is it okay to refill medication? Please verify dispense amount and number of refills. 

## 2010-12-17 ENCOUNTER — Other Ambulatory Visit: Payer: Self-pay | Admitting: *Deleted

## 2010-12-17 MED ORDER — HYDROCODONE-HOMATROPINE 5-1.5 MG/5ML PO SYRP: 5.0000 mL | ORAL_SOLUTION | Freq: Four times a day (QID) | ORAL | Status: DC | PRN

## 2010-12-17 NOTE — Telephone Encounter (Signed)
Please call in. If cough persists/increases, then follow up for recheck.

## 2010-12-17 NOTE — Telephone Encounter (Signed)
Spoke with patient and she did request this refill.  She continues to cough.

## 2010-12-18 NOTE — Telephone Encounter (Signed)
Rx called to [facility], patient advised as instructed via telephone.

## 2011-03-17 ENCOUNTER — Other Ambulatory Visit: Payer: Self-pay | Admitting: *Deleted

## 2011-03-17 MED ORDER — METOPROLOL TARTRATE 50 MG PO TABS: 50.0000 mg | ORAL_TABLET | Freq: Two times a day (BID) | ORAL | Status: DC

## 2011-03-17 NOTE — Telephone Encounter (Signed)
Received faxed refill request from pharmacy Refill sent electronically to pharmacy. 

## 2011-04-14 ENCOUNTER — Other Ambulatory Visit: Payer: Self-pay | Admitting: *Deleted

## 2011-04-14 MED ORDER — CITALOPRAM HYDROBROMIDE 20 MG PO TABS: 20.0000 mg | ORAL_TABLET | Freq: Every day | ORAL | Status: DC

## 2011-04-14 NOTE — Telephone Encounter (Signed)
Received faxed refill request from pharmacy. Is it okay to refill medication? 

## 2011-05-08 ENCOUNTER — Encounter: Payer: Self-pay | Admitting: Family Medicine

## 2011-05-08 ENCOUNTER — Ambulatory Visit (INDEPENDENT_AMBULATORY_CARE_PROVIDER_SITE_OTHER): Payer: 59 | Admitting: Family Medicine

## 2011-05-08 VITALS — BP 172/106 | HR 63 | Temp 98.3°F | Wt 171.0 lb

## 2011-05-08 DIAGNOSIS — F411 Generalized anxiety disorder: Secondary | ICD-10-CM

## 2011-05-08 DIAGNOSIS — Z23 Encounter for immunization: Secondary | ICD-10-CM

## 2011-05-08 DIAGNOSIS — F419 Anxiety disorder, unspecified: Secondary | ICD-10-CM

## 2011-05-08 DIAGNOSIS — I10 Essential (primary) hypertension: Secondary | ICD-10-CM

## 2011-05-08 LAB — COMPREHENSIVE METABOLIC PANEL
ALT: 20 U/L (ref 0–35)
Alkaline Phosphatase: 107 U/L (ref 39–117)
BUN: 18 mg/dL (ref 6–23)
Calcium: 9 mg/dL (ref 8.4–10.5)
Creatinine, Ser: 0.8 mg/dL (ref 0.4–1.2)
GFR: 78.45 mL/min (ref >60.00)
Potassium: 3.4 mEq/L — ABNORMAL LOW (ref 3.5–5.1)
Total Protein: 6.7 g/dL (ref 6.0–8.3)

## 2011-05-08 LAB — LIPID PANEL
Cholesterol: 190 mg/dL (ref 0–200)
LDL Cholesterol: 132 mg/dL — ABNORMAL HIGH (ref 0–99)
VLDL: 14.6 mg/dL (ref 0.0–40.0)

## 2011-05-08 MED ORDER — HYDROCODONE-HOMATROPINE 5-1.5 MG/5ML PO SYRP: 5.0000 mL | ORAL_SOLUTION | Freq: Every day | ORAL | Status: DC

## 2011-05-08 MED ORDER — CLONIDINE HCL 0.1 MG PO TABS: 0.1000 mg | ORAL_TABLET | Freq: Two times a day (BID) | ORAL | Status: DC
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
## 2011-05-08 NOTE — Patient Instructions (Addendum)
You can get your results through our phone system.  Follow the instructions on the blue card. Call back with BP readings in about 1-2 weeks and remind me to fill your meds at that point.

## 2011-05-13 ENCOUNTER — Encounter: Payer: Self-pay | Admitting: Family Medicine

## 2011-05-13 NOTE — Assessment & Plan Note (Signed)
No change in meds yet, she'll call back with more readings.  We'll address at that point.

## 2011-05-13 NOTE — Assessment & Plan Note (Signed)
We may need to inc the celexa, and not change BP meds.  She'll call back with update and we'll address at that point.  She agrees.

## 2011-07-08 ENCOUNTER — Telehealth: Payer: Self-pay | Admitting: *Deleted

## 2011-07-08 MED ORDER — METOPROLOL TARTRATE 50 MG PO TABS: 50.0000 mg | ORAL_TABLET | Freq: Two times a day (BID) | ORAL | Status: DC

## 2011-07-08 MED ORDER — LOSARTAN POTASSIUM-HCTZ 100-25 MG PO TABS: 1.0000 | ORAL_TABLET | Freq: Every day | ORAL | Status: DC

## 2011-07-08 MED ORDER — CLONIDINE HCL 0.1 MG PO TABS: 0.1000 mg | ORAL_TABLET | Freq: Two times a day (BID) | ORAL | Status: DC

## 2011-07-08 NOTE — Telephone Encounter (Signed)
Patient called to let Dr. Para March know that she feels good and her BP has been running 150/80-85.  She would like to get a refill on her BP medication if he feels comfortable with her BP being in this range.  Uses [facility].  Please advise.

## 2011-07-08 NOTE — Telephone Encounter (Signed)
I think that pressure is reasonable.  I sent a year of the BP meds in.  Thanks.

## 2011-07-08 NOTE — Telephone Encounter (Signed)
Patient notified as instructed by telephone. 

## 2011-11-04 ENCOUNTER — Other Ambulatory Visit: Payer: Self-pay | Admitting: Family Medicine

## 2012-06-07 ENCOUNTER — Other Ambulatory Visit: Payer: Self-pay | Admitting: Family Medicine

## 2012-06-07 NOTE — Telephone Encounter (Signed)
Sent.  I would have her schedule a visit for this spring. Thanks.

## 2012-06-07 NOTE — Telephone Encounter (Signed)
Electronic refill request.  Patient has not been seen in the office in some time with no upcoming appts scheduled.  Please advise.

## 2012-07-08 ENCOUNTER — Other Ambulatory Visit: Payer: Self-pay

## 2012-07-08 MED ORDER — LOSARTAN POTASSIUM-HCTZ 100-25 MG PO TABS: 1.0000 | ORAL_TABLET | Freq: Every day | ORAL | Status: DC

## 2012-07-08 NOTE — Telephone Encounter (Signed)
Pt last seen 05/08/11; pt does not have insurance now (is in process of getting insurance and will call back for appt after insurance is acquired)pt request refill losartan HCTZ to [facility].Please advise.

## 2012-07-08 NOTE — Telephone Encounter (Signed)
Sent.  Thanks for the update.

## 2012-07-11 ENCOUNTER — Other Ambulatory Visit: Payer: Self-pay | Admitting: Family Medicine

## 2012-10-06 ENCOUNTER — Other Ambulatory Visit: Payer: Self-pay

## 2012-10-06 MED ORDER — LOSARTAN POTASSIUM-HCTZ 100-25 MG PO TABS: 1.0000 | ORAL_TABLET | Freq: Every day | ORAL | Status: DC

## 2012-10-06 MED ORDER — METOPROLOL TARTRATE 50 MG PO TABS: ORAL_TABLET | ORAL | Status: DC

## 2012-10-06 MED ORDER — CLONIDINE HCL 0.1 MG PO TABS: ORAL_TABLET | ORAL | Status: DC

## 2012-10-06 MED ORDER — CITALOPRAM HYDROBROMIDE 20 MG PO TABS: ORAL_TABLET | ORAL | Status: DC

## 2012-10-06 NOTE — Telephone Encounter (Signed)
Sent in 1 mo supply with 2 refills.  Needs f/u with PCP

## 2012-10-06 NOTE — Telephone Encounter (Signed)
Pt request refill on Celexa, clonidine, losartan-hctz and metoprolol to [facility]. Pt has CPX scheduled 12/16/12; last seen 05/08/11. Pt said has meds to last for 2 weeks. (this is second refill request for these meds per protocol sending refill for doctor's approval).

## 2012-11-23 ENCOUNTER — Other Ambulatory Visit: Payer: Self-pay

## 2012-11-23 DIAGNOSIS — Z1231 Encounter for screening mammogram for malignant neoplasm of breast: Secondary | ICD-10-CM

## 2012-11-30 ENCOUNTER — Other Ambulatory Visit: Payer: Self-pay | Admitting: Family Medicine

## 2012-11-30 DIAGNOSIS — I10 Essential (primary) hypertension: Secondary | ICD-10-CM

## 2012-12-08 ENCOUNTER — Telehealth: Payer: Self-pay | Admitting: Radiology

## 2012-12-08 ENCOUNTER — Other Ambulatory Visit (INDEPENDENT_AMBULATORY_CARE_PROVIDER_SITE_OTHER): Payer: Self-pay

## 2012-12-08 DIAGNOSIS — I10 Essential (primary) hypertension: Secondary | ICD-10-CM

## 2012-12-08 LAB — LIPID PANEL
Cholesterol: 214 mg/dL — ABNORMAL HIGH (ref 0–200)
HDL: 52.9 mg/dL (ref >39.00)
Triglycerides: 99 mg/dL (ref 0.0–149.0)
VLDL: 19.8 mg/dL (ref 0.0–40.0)

## 2012-12-08 LAB — COMPREHENSIVE METABOLIC PANEL
CO2: 33 mEq/L — ABNORMAL HIGH (ref 19–32)
Creatinine, Ser: 0.8 mg/dL (ref 0.4–1.2)
GFR: 78.01 mL/min (ref >60.00)
Glucose, Bld: 95 mg/dL (ref 70–99)
Total Bilirubin: 0.7 mg/dL (ref 0.3–1.2)

## 2012-12-08 MED ORDER — POTASSIUM CHLORIDE CRYS ER 20 MEQ PO TBCR: 20.0000 meq | EXTENDED_RELEASE_TABLET | Freq: Every day | ORAL | Status: DC

## 2012-12-08 NOTE — Telephone Encounter (Signed)
Patient advised.

## 2012-12-08 NOTE — Telephone Encounter (Signed)
Call pt.  Her K is low, needs some extra.  Would take K dur a day.  rx sent.  We'll need to recheck that when she comes back in for the OV.  If we need to do the celiac labs, we can do that at the same time.  We'll talk about it at the OV.  Thanks.

## 2012-12-08 NOTE — Telephone Encounter (Signed)
[facility] called a critical K+ result. K+ 2.8, results given to Dr  Para March

## 2012-12-09 ENCOUNTER — Ambulatory Visit
Admission: RE | Admit: 2012-12-09 | Discharge: 2012-12-09 | Disposition: A | Discharge status: Home / Self Care | Payer: 59 | Source: Ambulatory Visit

## 2012-12-09 ENCOUNTER — Encounter: Payer: Self-pay | Admitting: Family Medicine

## 2012-12-09 DIAGNOSIS — Z1231 Encounter for screening mammogram for malignant neoplasm of breast: Secondary | ICD-10-CM

## 2012-12-13 ENCOUNTER — Encounter: Payer: Self-pay | Admitting: *Deleted

## 2012-12-16 ENCOUNTER — Ambulatory Visit (INDEPENDENT_AMBULATORY_CARE_PROVIDER_SITE_OTHER): Payer: 59 | Admitting: Family Medicine

## 2012-12-16 ENCOUNTER — Encounter: Payer: Self-pay | Admitting: Family Medicine

## 2012-12-16 VITALS — BP 144/98 | HR 54 | Temp 97.9°F | Ht 70.0 in | Wt 185.8 lb

## 2012-12-16 DIAGNOSIS — R635 Abnormal weight gain: Secondary | ICD-10-CM

## 2012-12-16 DIAGNOSIS — F419 Anxiety disorder, unspecified: Secondary | ICD-10-CM

## 2012-12-16 DIAGNOSIS — Z0000 Encounter for general adult medical examination without abnormal findings: Secondary | ICD-10-CM

## 2012-12-16 DIAGNOSIS — F411 Generalized anxiety disorder: Secondary | ICD-10-CM

## 2012-12-16 DIAGNOSIS — I10 Essential (primary) hypertension: Secondary | ICD-10-CM

## 2012-12-16 DIAGNOSIS — Z8379 Family history of other diseases of the digestive system: Secondary | ICD-10-CM

## 2012-12-16 LAB — TSH: TSH: 0.76 u[IU]/mL (ref 0.35–5.50)

## 2012-12-16 LAB — BASIC METABOLIC PANEL
Chloride: 101 mEq/L (ref 96–112)
Potassium: 3.3 mEq/L — ABNORMAL LOW (ref 3.5–5.1)

## 2012-12-16 MED ORDER — CLONIDINE HCL 0.1 MG PO TABS: ORAL_TABLET | ORAL | Status: DC

## 2012-12-16 MED ORDER — METOPROLOL TARTRATE 50 MG PO TABS: ORAL_TABLET | ORAL | Status: DC

## 2012-12-16 MED ORDER — LOSARTAN POTASSIUM-HCTZ 100-25 MG PO TABS: 1.0000 | ORAL_TABLET | Freq: Every day | ORAL | Status: DC

## 2012-12-16 NOTE — Patient Instructions (Addendum)
Go to the lab on the way out.  We'll contact you with your lab report. I sent your meds.   Take care.  Glad to see you.
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
## 2012-12-18 DIAGNOSIS — R635 Abnormal weight gain: Secondary | ICD-10-CM | POA: Insufficient documentation

## 2012-12-18 DIAGNOSIS — Z8379 Family history of other diseases of the digestive system: Secondary | ICD-10-CM | POA: Insufficient documentation

## 2012-12-18 MED ORDER — POTASSIUM CHLORIDE CRYS ER 20 MEQ PO TBCR: 20.0000 meq | EXTENDED_RELEASE_TABLET | Freq: Every day | ORAL | Status: DC

## 2012-12-18 NOTE — Assessment & Plan Note (Signed)
Routine anticipatory guidance given to patient.  See health maintenance. Tdap 2012 Flu yearly Pap 2012 Mammogram wnl DXA not indicated.  zosta due at 60.  PNA not due yet.  Living will d/w pt. Son Lesle Reek would be designated incapacitated.

## 2012-12-18 NOTE — Assessment & Plan Note (Signed)
Likely multifactorial, see notes on labs.
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
## 2012-12-18 NOTE — Assessment & Plan Note (Signed)
Continue current meds, see notes on labs.  

## 2012-12-18 NOTE — Assessment & Plan Note (Signed)
Awaiting labs.  See notes on labs.

## 2012-12-20 LAB — CELIAC PANEL 10
Endomysial Screen: NEGATIVE
Gliadin IgG: 4.9 U/mL (ref <20)
IgA: 135 mg/dL (ref 69–380)
Tissue Transglutaminase Ab, IgA: 3.6 U/mL (ref <20)

## 2013-01-05 ENCOUNTER — Other Ambulatory Visit: Payer: Self-pay | Admitting: *Deleted

## 2013-01-05 ENCOUNTER — Other Ambulatory Visit: Payer: Self-pay | Admitting: Family Medicine

## 2013-01-05 ENCOUNTER — Encounter: Payer: Self-pay | Admitting: Family Medicine

## 2013-01-05 MED ORDER — HYDROCODONE-HOMATROPINE 5-1.5 MG/5ML PO SYRP: 5.0000 mL | ORAL_SOLUTION | Freq: Four times a day (QID) | ORAL | Status: DC | PRN

## 2013-01-05 NOTE — Telephone Encounter (Signed)
Medication phoned to pharmacy per phone note.

## 2013-01-05 NOTE — Progress Notes (Signed)
Please call in the cough syrup.  Thanks.

## 2013-03-16 ENCOUNTER — Other Ambulatory Visit: Payer: Self-pay

## 2013-03-17 ENCOUNTER — Other Ambulatory Visit: Payer: Self-pay | Admitting: Family Medicine

## 2013-03-17 MED ORDER — ALPRAZOLAM 0.25 MG PO TABS: 0.2500 mg | ORAL_TABLET | Freq: Three times a day (TID) | ORAL | Status: DC | PRN

## 2013-03-17 MED ORDER — CITALOPRAM HYDROBROMIDE 20 MG PO TABS: 10.0000 mg | ORAL_TABLET | Freq: Every day | ORAL | Status: DC

## 2013-03-17 NOTE — Telephone Encounter (Signed)
Sent, she can take 1/2 to 1 tab.  Okay to start with 1/2 tab if needed.   If this isn't helping, then please let me know.  Thanks.

## 2013-03-17 NOTE — Telephone Encounter (Signed)
Please call in.  Thanks.   

## 2013-03-17 NOTE — Telephone Encounter (Signed)
Patient advised.

## 2013-03-17 NOTE — Telephone Encounter (Signed)
Patient is also asking if you would represcribe Citalopram 20 mg.  She had discontinued it's use but with the new stress, she feels she needs it.

## 2013-03-17 NOTE — Telephone Encounter (Signed)
My Chart refill request.  Please advise.  

## 2013-03-17 NOTE — Telephone Encounter (Signed)
Medication phoned to pharmacy.  

## 2013-06-18 ENCOUNTER — Emergency Department (HOSPITAL_COMMUNITY)
Admission: EM | Admit: 2013-06-18 | Discharge: 2013-06-18 | Disposition: A | Discharge status: Home / Self Care | Payer: Self-pay | Attending: Emergency Medicine | Admitting: Emergency Medicine

## 2013-06-18 ENCOUNTER — Emergency Department (HOSPITAL_COMMUNITY): Payer: Self-pay

## 2013-06-18 ENCOUNTER — Encounter (HOSPITAL_COMMUNITY): Payer: Self-pay | Admitting: Emergency Medicine

## 2013-06-18 DIAGNOSIS — T7492XA Unspecified child maltreatment, confirmed, initial encounter: Secondary | ICD-10-CM | POA: Insufficient documentation

## 2013-06-18 DIAGNOSIS — S1093XA Contusion of unspecified part of neck, initial encounter: Secondary | ICD-10-CM

## 2013-06-18 DIAGNOSIS — S0003XA Contusion of scalp, initial encounter: Secondary | ICD-10-CM | POA: Insufficient documentation

## 2013-06-18 DIAGNOSIS — Z8541 Personal history of malignant neoplasm of cervix uteri: Secondary | ICD-10-CM | POA: Insufficient documentation

## 2013-06-18 DIAGNOSIS — Z79899 Other long term (current) drug therapy: Secondary | ICD-10-CM | POA: Insufficient documentation

## 2013-06-18 DIAGNOSIS — T07XXXA Unspecified multiple injuries, initial encounter: Secondary | ICD-10-CM | POA: Insufficient documentation

## 2013-06-18 DIAGNOSIS — S0083XA Contusion of other part of head, initial encounter: Secondary | ICD-10-CM

## 2013-06-18 DIAGNOSIS — T7491XA Unspecified adult maltreatment, confirmed, initial encounter: Secondary | ICD-10-CM | POA: Insufficient documentation

## 2013-06-18 DIAGNOSIS — Y92009 Unspecified place in unspecified non-institutional (private) residence as the place of occurrence of the external cause: Secondary | ICD-10-CM | POA: Insufficient documentation

## 2013-06-18 DIAGNOSIS — T7421XA Adult sexual abuse, confirmed, initial encounter: Secondary | ICD-10-CM | POA: Insufficient documentation

## 2013-06-18 DIAGNOSIS — Y9389 Activity, other specified: Secondary | ICD-10-CM | POA: Insufficient documentation

## 2013-06-18 DIAGNOSIS — Z8619 Personal history of other infectious and parasitic diseases: Secondary | ICD-10-CM | POA: Insufficient documentation

## 2013-06-18 DIAGNOSIS — I10 Essential (primary) hypertension: Secondary | ICD-10-CM | POA: Insufficient documentation

## 2013-06-18 DIAGNOSIS — IMO0002 Reserved for concepts with insufficient information to code with codable children: Secondary | ICD-10-CM | POA: Insufficient documentation

## 2013-06-18 MED ORDER — LEVONORGESTREL 0.75 MG PO TABS
ORAL_TABLET | ORAL | Status: AC
  Filled 2013-06-18: qty 2

## 2013-06-18 MED ORDER — CEFIXIME 400 MG PO TABS
ORAL_TABLET | ORAL | Status: AC
  Administered 2013-06-18: 400 mg
  Filled 2013-06-18: qty 1

## 2013-06-18 MED ORDER — METRONIDAZOLE 500 MG PO TABS
ORAL_TABLET | ORAL | Status: AC
  Administered 2013-06-18: 500 mg
  Filled 2013-06-18: qty 4

## 2013-06-18 MED ORDER — AZITHROMYCIN 1 G PO PACK
PACK | ORAL | Status: AC
  Administered 2013-06-18: 1 g
  Filled 2013-06-18: qty 1

## 2013-06-18 MED ORDER — PROMETHAZINE HCL 25 MG PO TABS
ORAL_TABLET | ORAL | Status: AC
  Administered 2013-06-18: 25 mg
  Filled 2013-06-18: qty 3

## 2013-06-18 NOTE — Discharge Instructions (Signed)
° °Sexual Assault, Rape  °Sexual assault can be physical, verbal, visual, or anything that forces a person to have unwanted sexual contact. You are being sexually abused if you are forced to have sexual contact of any kind (vaginal, oral, or anal). Sexual assault is called rape if penetration has occurred. Sexual assault and rape are never the victim's fault.  °The physical dangers of rape include pregnancy, injury, and catching a sexually transmitted infection (STI). Your caregiver or emergency room doctor may recommend a number of tests to be done after a sexual assault or rape. A rape kit will collect evidence and check for infection and injury. You may be treated for an infection even if no signs of one are present. This may also be true if tests and cultures for disease are negative. Emergency contraceptive medications are also available to help prevent pregnancy, if this is desired. All of these options can be discussed with your caregiver. A sexual assault is a traumatic event. Counseling is available.  °STEPS TO TAKE IF A SEXUAL ASSAULT HAS HAPPENED  °Go to an area of safety as quickly as possible and call the police. This may include going to a shelter or staying with a friend. Stay away from the area where you have been attacked. Many times, sexual assaults are caused by a friend, relative, or associate.  °Do not wash, shower, comb your hair, or clean any part of your body.  °Do not change your clothes.  °Do not remove or touch anything in the area where you were assaulted.  °Go to an emergency room or your caregiver for a complete physical exam. Get the necessary tests to protect yourself from disease or pregnancy.  °If medications were given by your caregiver, take them as directed for the full length of time prescribed. If you have come in contact with a sexual infection, find out if you are to be tested again. If your caregiver is concerned about the HIV/AIDS virus, you may be required to have  continued testing for several months. Make sure you know how to get test results. It is your responsibility to get the results of all testing done.  °File the correct papers with the authorities. This is important for all assaults, even if they were done by a family member orfriend.  °Only take over-the-counter or prescription medicines for pain, discomfort, or fever as directed by your caregiver. °HOME CARE INSTRUCTIONS  °Carry mace or pepper spray for protection against an attacker.  °Do not try to fight off an attacker if he or she has a gun or knife.  °Be aware of your surroundings, what is happening around you, and who might be there.  °Be assertive, trust your instincts, and walk with confidence and direction.  °Always lock your doors and windows.  °Do not let anyone who you do not know enter your house.  °Get door safety restraints and always use them.  °Get a security system that has a siren if you are able.  °Protect your house and car keys. Do not lend them out. Do not put your name and address on them. If you lose them, get your locks changed.  °Always lock your car and have your key ready to open the door before approaching the car.  °Park in a well lit and busy area.  °Keep your car serviced. Always have at least half of a tank of gas in it.  ° °POSSIBLE TREATMENT: °·  You may have received oral or injectable   antibiotics to help prevent sexually transmitted infections. °· Hepatitis B vaccine- Immunization should be given if not already immunized.  This is a series of three injections, so any further injections should be obtained by your primary care physician. °· HPV (Human Papilloma Virus) vaccine (Gardisil)- Immunization should be given, if not immunized previously or not up-to-date. °· HIV (Human Immunodeficiency Virus)- Your caregiver will help you decide whether to begin the prophylactic medications, based on your circumstances.   °· Tetanus Immunization- This also may be recommended based on your  circumstances. °· Pregnancy Prevention-  Also called "emergency contraception."  This will be offered to you if the situation indicates. ° °SUGGESTED FOLLOW-UP CARE: °· Please follow-up with your medical care provider or where you/your child were referred (health department, women's clinic, pediatrician, etc) IN TEN DAYS TO TWO WEEKS.  We recommend the following during that visit, if indicated: °· Pregnancy test °· HIV, syphyllis, and Hepatitis blood testing °· Cultures for sexually transmitted infections °·  °Drive on lighted and frequently traveled streets.  °Do not go into isolated areas like open garages, empty offices, buildings, or laundry rooms alone.  °Do not walk or jog alone, especially when it is dark.  °Never hitchhike!  °If your car breaks down, call the police for help on your cell phone, put the hood of your car up, and a put a "HELP" sign on your front and back windows.  °If you are being followed, go to a busy store or to someone's house and call for help.  °If you are stopped by a police officer, especially in an unmarked police car, keep your door locked. Do not put your window down all the way. Ask them to show you identification first.  °Beware of "date rape drugs" that can be placed in a drink when you are not looking and can make you unable to fight off an assault. They usually cause memory loss. °If you know someone who has been sexually abused or raped, take them to a hospital or to the police or call your local emergency services for help.  °SEEK MEDICAL CARE IF:  °You have new problems because of your injuries.  °You have problems that may be because of the medicine you are taking. These problems may include rash, itching, swelling, or trouble breathing.  °You develop belly (abdominal) pain, you feel sick to your stomach (nausea), or you vomit.  °You have an oral temperature above 102° F (38.9° C).  °You need supportive care or referral to a rape crisis center. These are centers with  trained personnel who can help you get through this ordeal.  °You have abnormal vaginal bleeding.  °You have abnormal vaginal discharge. °SEEK IMMEDIATE MEDICAL CARE IF:  °You have been sexually assaulted or raped. Call your local emergency department (911 in the [place] for help.  °You are afraid of being threatened, beaten, or abused. Call your local emergency department (911 in the [place] for help.  °You receive new injuries related to abuse.  °You have an oral temperature above 102° F (38.9° C), not controlled by medicine. °For more information on sexual assault and rape call the National [facility] [facility] Information [facility] at 1-800-994-9662.  °Document Released: 04/24/2000 Document Revised: 07/20/2011 Document Reviewed: 06/07/2008  °[employer]® Patient Information ©2014 [employer].  ° °For all of the medications you have received: ° °AVOID HAVING SEXUAL CONTACT UNTIL A WEEK AFTER ALL TREATMENT.  IF YOU HAVE CONTACTED A SEXUALLY TRANSMITTED INFECTION, YOUR PARTNER CAN BECOME INFECTED. ° °  Do not share any of these medications with others.  Store at room temperature, away from light and moisture.  Do not store in the bathroom.  Keep all medicines away from children and pets.  Do not flush medications down the toilet or pour them in the drain.  Properly discard (contact a pharmacy) when a medication is expired or no longer needed. ° °Possible side effects:   ° °Report to your healthcare provider the following:  Allergic reactions such as skin rash, itching or hives, swelling of the face, lips, or tongue; confusion; nightmares; hallucinations; dark urine or difficulty passing urine; difficulty breathing, hearing loss, irregular heartbeat or chest pain; pale or black stools; redness, blistering, peeling or loosening of the skin including inside the mouth; white patches or sores in the mouth; yellowing of the eyes or skin; feeling anxious or agitated; fever, chills, cough, sore throat or body aches; vomiting within one  hour of taking the medicine. ° °Report only if these become bothersome:  Diarrhea, dizziness, headache, stomach upset or vomiting, tooth discoloration, vaginal irritation, or numbness in part of your body. ° °Precautions:  Your healthcare provider (HCP) needs to know if you have any of the following conditions:  Kidney disease, liver disease, irregular heartbeat or heart disease, an unusual or allergic reaction to any medications, foods, dyes, preservatives, or if you are pregnant or trying to get pregnant, or are breastfeeding. ° °Tell your HCP if your symptoms do not improve.  Do not treat diarrhea with over-the-counter products.  Contact your HCP if you have diarrhea that lasts more than 2 days or if it is severe and watery. ° °Potential interactions:  Question your healthcare provider if you are taking any of the following medications:  Lincomycin, amiodarone, antacids, cyclosporine (Gengraf, Neoral, Sandimmune), digoxin (Digitek, Lanoxin), dihydroergotamine or ergotamine, Cafergot, Ergomar, Migranal, magnesium, nelfinavir, phenytoin, warfarin (Coumadin), atorvastatin (Lipitor), cetirizine (Zyrtec), medications for HIV or AIDS (efavirenz, indinavir, nelfinavir, zidovudine, Retrovir, Videx, or Viracept), or for seizure (carbamaepine, hexobarbital, phenytoin, Carbartrol, Dilantin, Tegretol, phenobarbital), sodium tetradecyl sulfate, drug or herbal products that induce enzymes such as CYP3A4, amprenavir, bosentan, modafinil, nevirapine, ritonavir, griseofulvin, rifamycins including rifabutin, St. John's Wort, troleandomycin, topiramate, felbamate, alcohol, MAO inhibitors (Nardil, Parnate, Marplan, Eldepryl), trimethobenzamide, bromocriptine, certain antidepressants, certain antihistamines, epinephrine, levodopa, medications for sleep, mental health problems, and psychotic disturbances such as amitriptyline, doxepin, nortriptyline, phenylzine, selegiline, Elavil, Pamelor, Sinequan, or medications for Parkinson's  Disease, stomach problems, muscle relaxants, narcotic pain medicines or sedatives, amprenavir oral solution, paclitaxel injection, ritonavir oral solution, sertraline oral solution, sulfamethoxazole-trimethoprim injection, disulfiram (Antabuse), cimetidine (Tagamet), lithium (Eskalith),. ° °SPECIFIC INSTRUCTIONS FOR EACH MEDICATION (YOU MAY HAVE BEEN GIVEN ALL OR SOME OF THESE: ° °Azithromycin  (liquid slurry) °Also known as: Zithromax, Zmax, Z-pak ° °Uses:  This is a macrolide antibiotic.  It is used to treat or prevent certain kinds of bacterial infections, including Chlamydia.  This medication may be used for other other purposes, but will not work for viruses such as the cold or flu. ° °Cefixime  (big pill) °Also known as:  Suprax ° °Uses:  This medication is known as a cephalosporin antibiotic.  It is used to treat a wide variety of bacterial infections, including Gonorrhea, ° °Ceftriaxone (Injection/Shot) °Also known as:  Rocephin ° °Uses:  This medication is known as a cephalosporin antibiotic.  It is used to treat certain kinds of bacterial infections. ° °Metronidazole (4 pills at once) °Also known as:  Flagyl or Helidac Therapy ° °Uses:  This medication is used   to treat certain kinds of baterial and protozoal infections, including Trichomoniasis (otherwise known as Trichomonas or "Trick"), which is an infection of the sex organs in men and women).  Delay taking this medication if you have had any alcohol in the past 48 hours.  Avoid alcohol (including mouthwash and cough medicine) for 48 hours afterward. ° °Levonorgestrel (little pill(s)) °Also known as:  Plan B or Next Choice ° °Uses:  This medication is used in women to prevent pregnancy after unprotected sex or after failure of another birth control method.  It is a progestin hormone that helps to prevent pregnancy by delaying ovulation (the release of an egg) and possibly changing the uterine & cervical mucus to make it more difficult for fertilization  (when the egg and sperm meet), or for the fertilized egg to attach to the uterus (implantation).  Using this medicine will not not stop an existing pregnancy or protect you against Sexually Transmitted Infections (STIs).  This medication should not be used as a regular form of birth control.  This medication works best if taken with 72 hours (3 days) of unprotected sex.  If you vomit with 2 hours of taking the medication, consider calling a pharmacy about repeating the dose.  This medication can be used at any time during your menstrual cycle, and your period amount and timing can be irregular after taking it, during the first month or two.  If your period is more than 7 days late, you may want to take a pregnancy test. ° °Promethazine (pack of 3 for home use) °Also known as:  Phenergan ° °Uses:  This medication is an antihistamine.  It can be used to treat allergic reactions and to treat or prevent nausea and vomiting.  It is also used to make you sleep and to help treat pain or nausea.  Take 1/2 to 1 whole tablet as needed for nausea or sleep.  Do not take doses any closer than 6 hours.  If unable to swallow the pill, it may be placed in the vagina or rectum with the same results (there may be some tingling noted). ° °Do not drive or be responsible for the care of young children as this medication will make you drowsy. °
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
## 2013-06-18 NOTE — ED Notes (Signed)
Pt removed from track board, pt was taken to SANE room for further evaluation. At time of completion discharged home.
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
## 2013-06-18 NOTE — ED Provider Notes (Signed)
Medical screening examination/treatment/procedure(s) were performed by non-physician practitioner and as supervising physician I was immediately available for consultation/collaboration.    Koi Zangara, MD 06/18/13 0722 

## 2013-06-18 NOTE — ED Notes (Signed)
SANE RN returned call and will be on the way to evaluate patient
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
## 2013-06-18 NOTE — ED Provider Notes (Signed)
Medical screening examination/treatment/procedure(s) were performed by non-physician practitioner and as supervising physician I was immediately available for consultation/collaboration.    Teressa Lower, MD 06/18/13 2251

## 2013-06-18 NOTE — ED Provider Notes (Signed)
8:17 AM SANE has been to see the patient and is ready to accept transfer upstairs for examination.  I offered the patient a prescription for pain medication and she declined, prefers to use 800mg  ibuprofen she has at home.  No other needs at this time.   [place], PA-C 06/18/13 [phone]

## 2013-06-19 ENCOUNTER — Telehealth: Payer: Self-pay | Admitting: Family Medicine
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
## 2013-10-23 ENCOUNTER — Other Ambulatory Visit: Payer: Self-pay | Admitting: *Deleted

## 2013-10-23 MED ORDER — ALPRAZOLAM 0.5 MG PO TABS: 0.5000 mg | ORAL_TABLET | Freq: Two times a day (BID) | ORAL | Status: DC | PRN

## 2013-10-23 NOTE — Telephone Encounter (Signed)
Patient is requesting a 0.5 mg tablet.

## 2013-10-23 NOTE — Telephone Encounter (Signed)
Patient advised.

## 2013-10-23 NOTE — Telephone Encounter (Signed)
Please see about getting a CPE scheduled for later in the year, or at least a f/u visit in the meantime.  Thanks.

## 2013-12-15 ENCOUNTER — Encounter: Payer: Self-pay | Admitting: Family Medicine

## 2013-12-15 ENCOUNTER — Ambulatory Visit (INDEPENDENT_AMBULATORY_CARE_PROVIDER_SITE_OTHER): Payer: Self-pay | Admitting: Family Medicine

## 2013-12-15 VITALS — BP 160/80 | HR 60 | Temp 98.5°F | Wt 164.8 lb

## 2013-12-15 DIAGNOSIS — F419 Anxiety disorder, unspecified: Secondary | ICD-10-CM

## 2013-12-15 DIAGNOSIS — I10 Essential (primary) hypertension: Secondary | ICD-10-CM

## 2013-12-15 DIAGNOSIS — F411 Generalized anxiety disorder: Secondary | ICD-10-CM

## 2013-12-15 MED ORDER — CITALOPRAM HYDROBROMIDE 10 MG PO TABS: 10.0000 mg | ORAL_TABLET | Freq: Every day | ORAL | Status: DC

## 2013-12-15 MED ORDER — LOSARTAN POTASSIUM-HCTZ 100-25 MG PO TABS: 1.0000 | ORAL_TABLET | Freq: Every day | ORAL | Status: DC

## 2013-12-15 MED ORDER — METOPROLOL TARTRATE 50 MG PO TABS: ORAL_TABLET | ORAL | Status: DC

## 2013-12-15 MED ORDER — ALPRAZOLAM 0.5 MG PO TABS: 0.5000 mg | ORAL_TABLET | Freq: Two times a day (BID) | ORAL | Status: DC | PRN

## 2013-12-15 MED ORDER — CLONIDINE HCL 0.1 MG PO TABS: ORAL_TABLET | ORAL | Status: DC
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
## 2013-12-15 NOTE — Patient Instructions (Signed)
Go to the lab on the way out.  We'll contact you with your lab report. If your BP is consistently >140/>90, or if you are consistently lightheaded, then notify me.  Take care.  Glad to see you.

## 2013-12-17 ENCOUNTER — Encounter: Payer: Self-pay | Admitting: Family Medicine

## 2013-12-17 NOTE — Assessment & Plan Note (Signed)
Would continue current meds for now.  She is in counseling and states she is safe at home. I'll defer to her.  My prev concerns were noted in the conversation.  I will continue to try to support the patient.

## 2013-12-17 NOTE — Assessment & Plan Note (Signed)
Controlled at home, check basic labs, continue current meds for now.  D/w pt.  She agrees. >25 minutes spent in face to face time with patient, >50% spent in counselling or coordination of care.

## 2013-12-20 ENCOUNTER — Other Ambulatory Visit (INDEPENDENT_AMBULATORY_CARE_PROVIDER_SITE_OTHER): Payer: Self-pay

## 2013-12-20 DIAGNOSIS — I10 Essential (primary) hypertension: Secondary | ICD-10-CM

## 2013-12-21 LAB — BASIC METABOLIC PANEL
BUN: 23 mg/dL (ref 6–23)
CHLORIDE: 101 meq/L (ref 96–112)
CO2: 30 meq/L (ref 19–32)
Calcium: 9.3 mg/dL (ref 8.4–10.5)
Creatinine, Ser: 0.9 mg/dL (ref 0.4–1.2)
GFR: 67 mL/min (ref >60.00)
Glucose, Bld: 108 mg/dL — ABNORMAL HIGH (ref 70–99)
POTASSIUM: 3.2 meq/L — AB (ref 3.5–5.1)
SODIUM: 140 meq/L (ref 135–145)

## 2013-12-22 ENCOUNTER — Encounter: Payer: Self-pay | Admitting: *Deleted

## 2014-03-24 ENCOUNTER — Other Ambulatory Visit: Payer: Self-pay | Admitting: Family Medicine

## 2014-03-26 NOTE — Telephone Encounter (Signed)
Received refill request electronically from pharmacy. Refill request does not match medication list. Last office visit 12/15/13. Is it okay to refill as requested?

## 2014-03-27 NOTE — Telephone Encounter (Signed)
Okay,sent, thanks.

## 2015-01-10 ENCOUNTER — Other Ambulatory Visit: Payer: Self-pay | Admitting: Family Medicine

## 2015-06-14 ENCOUNTER — Ambulatory Visit: Payer: Self-pay | Admitting: Internal Medicine

## 2015-06-19 ENCOUNTER — Ambulatory Visit: Payer: Self-pay | Admitting: Cardiovascular Disease

## 2015-06-21 ENCOUNTER — Encounter: Payer: Self-pay | Admitting: Cardiovascular Disease

## 2015-06-21 ENCOUNTER — Ambulatory Visit (INDEPENDENT_AMBULATORY_CARE_PROVIDER_SITE_OTHER): Payer: Self-pay | Admitting: Cardiovascular Disease

## 2015-06-21 VITALS — BP 160/102 | HR 64 | Ht 70.0 in | Wt 181.4 lb

## 2015-06-21 DIAGNOSIS — R0602 Shortness of breath: Secondary | ICD-10-CM | POA: Insufficient documentation

## 2015-06-21 DIAGNOSIS — R42 Dizziness and giddiness: Secondary | ICD-10-CM

## 2015-06-21 DIAGNOSIS — I10 Essential (primary) hypertension: Secondary | ICD-10-CM

## 2015-06-21 NOTE — Assessment & Plan Note (Signed)
History of progressive shortness of breath both at rest and with exertion over the last 6-12 months. There is some associated chest pressure. I'm going to get extra and exercise Myoview, and 2-D echocardiogram to further evaluate

## 2015-06-21 NOTE — Assessment & Plan Note (Signed)
History of hypertension for the last 20 years poorly controlled on clonidine, losartan, hydrochlorothiazide and metoprolol. We will obtain renal Doppler studies.

## 2015-06-21 NOTE — Progress Notes (Signed)
06/21/2015 Pamela Reynolds   08/12/1955  [id number]  Primary Physician Pamela Stain, MD Primary Cardiologist: Pamela Harp MD Pamela Reynolds   HPI:  Pamela Reynolds is a 60 year old mild to moderately overweight married Caucasian female mother of 2 children from her prior marriage, grandmother to 2 grandchildren who is self-referred for evaluation of persistent hypertension and progressive shortness of breath. She has a dental hygienist's. Her primary care physician is Dr. Elsie Reynolds. She does not smoke. Her father did die of sudden cardiac death at age 45. She's had hypertension for the last 20 years which has been more poorly controlled of late. She is on 4 antihypertensive medications. She complains of generalized fatigue and increasing shortness of breath over the last 6-12 months with some occasional chest tightness.   Current Outpatient Prescriptions  Medication Sig Dispense Refill  . ALPRAZolam (XANAX) 0.5 MG tablet Take 1 tablet (0.5 mg total) by mouth 2 (two) times daily as needed for anxiety. 30 tablet 1  . citalopram (CELEXA) 20 MG tablet TAKE 1/2 TO 1 TABLET BY MOUTH ONCE A DAY 90 tablet 3  . cloNIDine (CATAPRES) 0.1 MG tablet TAKE 1 TABLET BY MOUTH TWICE DAILY. 180 tablet 3  . losartan-hydrochlorothiazide (HYZAAR) 100-25 MG per tablet TAKE 1 TABLET BY MOUTH DAILY. 90 tablet 3  . metoprolol (LOPRESSOR) 50 MG tablet TAKE 1 TABLET BY MOUTH 2 TIMES A DAY. 180 tablet 3  . Multiple Vitamin (MULTIVITAMIN WITH MINERALS) TABS tablet Take 1 tablet by mouth daily.    Marland Kitchen ibuprofen (ADVIL,MOTRIN) 800 MG tablet Take 800 mg by mouth every 8 (eight) hours as needed.     No current facility-administered medications for this visit.    Allergies  Allergen Reactions  . Amlodipine Besylate     REACTION: edema and rash on bilateral ankles  . Lisinopril     REACTION: presumed ACE cough    Social History   Social History  . Marital Status: Married    Spouse Name: N/A  . Number of  Children: 2  . Years of Education: N/A   Occupational History  . Dental Hygienist in [place] History Main Topics  . Smoking status: Never Smoker   . Smokeless tobacco: Not on file  . Alcohol Use: Yes     Comment: Very minimal (husband with h/o ETOH abuse)  . Drug Use: No  . Sexual Activity: Not on file   Other Topics Concern  . Not on file   Social History Narrative   From [place] state.  Married 2000, second marriage.  2 grown kids, 2 grandkids.   Dental hygienist.       Review of Systems: General: negative for chills, fever, night sweats or weight changes.  Cardiovascular: negative for chest pain, dyspnea on exertion, edema, orthopnea, palpitations, paroxysmal nocturnal dyspnea or shortness of breath Dermatological: negative for rash Respiratory: negative for cough or wheezing Urologic: negative for hematuria Abdominal: negative for nausea, vomiting, diarrhea, bright red blood per rectum, melena, or hematemesis Neurologic: negative for visual changes, syncope, or dizziness All other systems reviewed and are otherwise negative except as noted above.    Blood pressure 160/102, pulse 64, height 5\' 10"  (1.778 m), weight 181 lb 7 oz (82.3 kg).  General appearance: alert and mild distress Neck: no adenopathy, no carotid bruit, no JVD, supple, symmetrical, trachea midline and thyroid not enlarged, symmetric, no tenderness/mass/nodules Lungs: clear to auscultation bilaterally Heart: regular rate and rhythm,  S1, S2 normal, no murmur, click, rub or gallop Extremities: extremities normal, atraumatic, no cyanosis or edema  EKG normal sinus rhythm at 65 with nonspecific ST and T-wave changes. I personally reviewed this EKG  ASSESSMENT AND PLAN:   Essential hypertension History of hypertension for the last 20 years poorly controlled on clonidine, losartan, hydrochlorothiazide and metoprolol. We will obtain renal Doppler studies.  Shortness of  breath History of progressive shortness of breath both at rest and with exertion over the last 6-12 months. There is some associated chest pressure. I'm going to get extra and exercise Myoview, and 2-D echocardiogram to further evaluate      Pamela Harp MD [facility], [facility] 06/21/2015 11:15 AM

## 2015-06-21 NOTE — Patient Instructions (Signed)
Schedule Stress Myoview   Schedule 2D Echo   Schedule Renal Dopplers   Follow Up With Dr.Berry after test

## 2015-07-09 ENCOUNTER — Ambulatory Visit (HOSPITAL_COMMUNITY): Payer: BLUE CROSS/BLUE SHIELD | Attending: Cardiovascular Disease

## 2015-07-09 ENCOUNTER — Ambulatory Visit (INDEPENDENT_AMBULATORY_CARE_PROVIDER_SITE_OTHER): Payer: BLUE CROSS/BLUE SHIELD | Admitting: Family Medicine

## 2015-07-09 ENCOUNTER — Other Ambulatory Visit: Payer: Self-pay

## 2015-07-09 ENCOUNTER — Encounter: Payer: Self-pay | Admitting: Family Medicine

## 2015-07-09 VITALS — BP 142/92 | HR 60 | Temp 98.5°F | Wt 182.0 lb

## 2015-07-09 DIAGNOSIS — J069 Acute upper respiratory infection, unspecified: Secondary | ICD-10-CM

## 2015-07-09 DIAGNOSIS — R42 Dizziness and giddiness: Secondary | ICD-10-CM | POA: Diagnosis not present

## 2015-07-09 DIAGNOSIS — I10 Essential (primary) hypertension: Secondary | ICD-10-CM

## 2015-07-09 DIAGNOSIS — R0602 Shortness of breath: Secondary | ICD-10-CM | POA: Insufficient documentation

## 2015-07-09 DIAGNOSIS — I119 Hypertensive heart disease without heart failure: Secondary | ICD-10-CM | POA: Diagnosis not present

## 2015-07-09 DIAGNOSIS — F419 Anxiety disorder, unspecified: Secondary | ICD-10-CM | POA: Diagnosis not present

## 2015-07-09 DIAGNOSIS — I351 Nonrheumatic aortic (valve) insufficiency: Secondary | ICD-10-CM | POA: Insufficient documentation

## 2015-07-09 MED ORDER — ALPRAZOLAM 0.5 MG PO TABS: 0.5000 mg | ORAL_TABLET | Freq: Two times a day (BID) | ORAL | Status: DC | PRN

## 2015-07-09 MED ORDER — CITALOPRAM HYDROBROMIDE 20 MG PO TABS: ORAL_TABLET | ORAL | Status: DC

## 2015-07-09 MED ORDER — HYDROCODONE-HOMATROPINE 5-1.5 MG/5ML PO SYRP: 5.0000 mL | ORAL_SOLUTION | Freq: Three times a day (TID) | ORAL | Status: DC | PRN

## 2015-07-09 NOTE — Patient Instructions (Signed)
Hycodan as needed for cough.  Rest and fluids today in the meantime.  Take care.  Glad to see you.
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
## 2015-07-10 DIAGNOSIS — J069 Acute upper respiratory infection, unspecified: Secondary | ICD-10-CM | POA: Insufficient documentation
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
## 2015-07-10 NOTE — Assessment & Plan Note (Signed)
Likely nonflu viral process.  D/w pt. Supportive care.  Use cough medicine with routine cautions.  She agrees.  Nontoxic.

## 2015-07-12 ENCOUNTER — Encounter (HOSPITAL_COMMUNITY): Payer: Self-pay

## 2015-07-12 ENCOUNTER — Ambulatory Visit: Payer: Self-pay | Admitting: Women's Health

## 2015-07-17 ENCOUNTER — Telehealth (HOSPITAL_COMMUNITY): Payer: Self-pay

## 2015-07-17 NOTE — Telephone Encounter (Signed)
Encounter complete. 

## 2015-07-18 ENCOUNTER — Telehealth (HOSPITAL_COMMUNITY): Payer: Self-pay

## 2015-07-18 NOTE — Telephone Encounter (Signed)
Encounter complete. 

## 2015-07-19 ENCOUNTER — Ambulatory Visit (HOSPITAL_COMMUNITY)
Admission: RE | Admit: 2015-07-19 | Discharge: 2015-07-19 | Disposition: A | Discharge status: Home / Self Care | Payer: BLUE CROSS/BLUE SHIELD | Source: Ambulatory Visit | Attending: Cardiovascular Disease | Admitting: Cardiovascular Disease

## 2015-07-19 ENCOUNTER — Encounter: Payer: Self-pay | Admitting: Women's Health

## 2015-07-19 ENCOUNTER — Ambulatory Visit (INDEPENDENT_AMBULATORY_CARE_PROVIDER_SITE_OTHER): Payer: BLUE CROSS/BLUE SHIELD | Admitting: Women's Health

## 2015-07-19 ENCOUNTER — Ambulatory Visit (HOSPITAL_BASED_OUTPATIENT_CLINIC_OR_DEPARTMENT_OTHER)
Admission: RE | Admit: 2015-07-19 | Discharge: 2015-07-19 | Disposition: A | Discharge status: Home / Self Care | Payer: BLUE CROSS/BLUE SHIELD | Source: Ambulatory Visit | Attending: Cardiovascular Disease | Admitting: Cardiovascular Disease

## 2015-07-19 VITALS — BP 160/94 | Ht 66.5 in | Wt 181.0 lb

## 2015-07-19 DIAGNOSIS — R0602 Shortness of breath: Secondary | ICD-10-CM | POA: Diagnosis not present

## 2015-07-19 DIAGNOSIS — R079 Chest pain, unspecified: Secondary | ICD-10-CM | POA: Insufficient documentation

## 2015-07-19 DIAGNOSIS — I10 Essential (primary) hypertension: Secondary | ICD-10-CM

## 2015-07-19 DIAGNOSIS — Z13820 Encounter for screening for osteoporosis: Secondary | ICD-10-CM | POA: Diagnosis not present

## 2015-07-19 DIAGNOSIS — B009 Herpesviral infection, unspecified: Secondary | ICD-10-CM

## 2015-07-19 DIAGNOSIS — R42 Dizziness and giddiness: Secondary | ICD-10-CM | POA: Diagnosis not present

## 2015-07-19 DIAGNOSIS — Z01419 Encounter for gynecological examination (general) (routine) without abnormal findings: Secondary | ICD-10-CM

## 2015-07-19 DIAGNOSIS — R5383 Other fatigue: Secondary | ICD-10-CM | POA: Diagnosis not present

## 2015-07-19 DIAGNOSIS — R93422 Abnormal radiologic findings on diagnostic imaging of left kidney: Secondary | ICD-10-CM | POA: Diagnosis not present

## 2015-07-19 DIAGNOSIS — Z8249 Family history of ischemic heart disease and other diseases of the circulatory system: Secondary | ICD-10-CM | POA: Diagnosis not present

## 2015-07-19 LAB — MYOCARDIAL PERFUSION IMAGING
CHL CUP NUCLEAR SDS: 1
CHL CUP NUCLEAR SRS: 0
CHL CUP NUCLEAR SSS: 1
LV sys vol: 56 mL
LVDIAVOL: 125 mL (ref 46–106)
TID: 1.13

## 2015-07-19 MED ORDER — TECHNETIUM TC 99M SESTAMIBI GENERIC - CARDIOLITE
30.5000 | Freq: Once | INTRAVENOUS | Status: AC | PRN
  Administered 2015-07-19: 30.5 via INTRAVENOUS

## 2015-07-19 MED ORDER — REGADENOSON 0.4 MG/5ML IV SOLN
0.4000 mg | Freq: Once | INTRAVENOUS | Status: AC
  Administered 2015-07-19: 0.4 mg via INTRAVENOUS

## 2015-07-19 MED ORDER — AMINOPHYLLINE 25 MG/ML IV SOLN
75.0000 mg | Freq: Once | INTRAVENOUS | Status: AC
  Administered 2015-07-19: 75 mg via INTRAVENOUS

## 2015-07-19 MED ORDER — TECHNETIUM TC 99M SESTAMIBI GENERIC - CARDIOLITE
10.4000 | Freq: Once | INTRAVENOUS | Status: AC | PRN
  Administered 2015-07-19: 10.4 via INTRAVENOUS

## 2015-07-19 MED ORDER — VALACYCLOVIR HCL 500 MG PO TABS: 500.0000 mg | ORAL_TABLET | Freq: Two times a day (BID) | ORAL | Status: DC

## 2015-07-19 NOTE — Patient Instructions (Signed)

## 2015-07-19 NOTE — Addendum Note (Signed)
Addended by: Thurnell Garbe A on: 07/19/2015 04:13 PM   Modules accepted: Orders
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
## 2015-07-22 LAB — PAP IG AND HPV HIGH-RISK: HPV DNA High Risk: NOT DETECTED

## 2015-08-02 ENCOUNTER — Ambulatory Visit: Payer: Self-pay | Admitting: Cardiovascular Disease

## 2015-08-13 ENCOUNTER — Ambulatory Visit: Payer: Self-pay | Admitting: Cardiovascular Disease

## 2015-08-16 ENCOUNTER — Ambulatory Visit (INDEPENDENT_AMBULATORY_CARE_PROVIDER_SITE_OTHER): Payer: BLUE CROSS/BLUE SHIELD | Admitting: Cardiovascular Disease

## 2015-08-16 ENCOUNTER — Encounter: Payer: Self-pay | Admitting: Cardiovascular Disease

## 2015-08-16 VITALS — BP 138/90 | HR 58 | Ht 70.0 in | Wt 183.6 lb

## 2015-08-16 DIAGNOSIS — I10 Essential (primary) hypertension: Secondary | ICD-10-CM | POA: Diagnosis not present

## 2015-08-16 DIAGNOSIS — R93429 Abnormal radiologic findings on diagnostic imaging of unspecified kidney: Secondary | ICD-10-CM | POA: Diagnosis not present

## 2015-08-16 MED ORDER — AMLODIPINE BESYLATE 5 MG PO TABS: 5.0000 mg | ORAL_TABLET | Freq: Every day | ORAL | Status: DC

## 2015-08-16 NOTE — Assessment & Plan Note (Signed)
Pamela Reynolds had outpatient studies for evaluation of shortness of breath. Her 2-D echo revealed concentric moderate lethargy hypertrophy with grade 1 diastolic dysfunction and her Myoview stress test was low risk.

## 2015-08-16 NOTE — Patient Instructions (Addendum)
Medication Instructions:  Your physician has recommended you make the following change in your medication:  1) START Norvasc 5 mg tablet by mouth ONCE daily   Labwork: Your physician recommends that you return for lab work in: Morning Labs - Before 10am The lab can be found on the FIRST FLOOR of out building in [address]    Testing/Procedures: MRI examination of the abdomen to review your kidneys.   Follow-Up: Your physician recommends that you schedule a follow-up appointment in: 4 weeks with Erasmo Downer - [facility]  Your physician recommends that you schedule a follow-up appointment in: 4 months with Dr. Gwenlyn Found   Any Other Special Instructions Will Be Listed Below (If Applicable). Dr. Gwenlyn Found would like you to check your blood pressure DAILY for the next 4 weeks.  Keep a journal of these daily BP and heart rate reading and call our office with the results.     If you need a refill on your cardiac medications before your next appointment, please call your pharmacy.

## 2015-08-16 NOTE — Assessment & Plan Note (Signed)
History of hypertensive blood pressure measured at 138/90. Recent renal Doppler studies were negative. Patient avoid salt. She is on clonidine 0.1 mg, losartan and hydrochlorothiazide as well as metoprolol. I'm going to add low-dose amlodipine. Estrogen for blood pressure log over the next 30 days. She will see Erasmo Downer back in follow-up after that and me back in 4 months. We'll we will also check morning aldosterone and renin levels.

## 2015-08-16 NOTE — Progress Notes (Signed)
Mrs. Claywell returns today for follow-up of her outpatient noninvasive diagnostic studies.her Myoview stress test was low risk and 2-D echo revealed moderate concentric LVH with normal LV function and diastolic dysfunction grade 1. Renal Dopplers were negative. Her blood pressure is borderline today. I'm going to add low-dose amlodipine and have her keep a blood pressure log. She will see Erasmo Downer 1 month for follow-up. I will see back in 3-4 months.

## 2015-08-20 ENCOUNTER — Telehealth: Payer: Self-pay | Admitting: Pediatrics

## 2015-08-20 NOTE — Telephone Encounter (Signed)
Received a call from [place] regarding MRI of the abdomen Discussed with Maudie Mercury RN with Dr Gwenlyn Found and MRI without contrast was the correct test Information given to [place]

## 2015-08-23 ENCOUNTER — Ambulatory Visit
Admission: RE | Admit: 2015-08-23 | Discharge: 2015-08-23 | Disposition: A | Discharge status: Home / Self Care | Payer: BLUE CROSS/BLUE SHIELD | Source: Ambulatory Visit | Attending: Cardiovascular Disease | Admitting: Cardiovascular Disease

## 2015-08-23 DIAGNOSIS — I10 Essential (primary) hypertension: Secondary | ICD-10-CM

## 2015-08-23 DIAGNOSIS — R93429 Abnormal radiologic findings on diagnostic imaging of unspecified kidney: Secondary | ICD-10-CM

## 2015-08-30 ENCOUNTER — Telehealth: Payer: Self-pay

## 2015-08-30 DIAGNOSIS — Z79899 Other long term (current) drug therapy: Secondary | ICD-10-CM

## 2015-08-30 LAB — ALDOSTERONE + RENIN ACTIVITY W/ RATIO
ALDO / PRA RATIO: 100 ratio — AB (ref 0.9–28.9)
Aldosterone: 6 ng/dL
PRA LC/MS/MS: 0.06 ng/mL/h — ABNORMAL LOW (ref 0.25–5.82)

## 2015-08-30 MED ORDER — SPIRONOLACTONE 25 MG PO TABS: 25.0000 mg | ORAL_TABLET | Freq: Every day | ORAL | Status: DC

## 2015-08-30 MED ORDER — AMLODIPINE BESYLATE 5 MG PO TABS: 2.5000 mg | ORAL_TABLET | Freq: Every day | ORAL | Status: DC

## 2015-08-30 NOTE — Telephone Encounter (Signed)
-----   Message from Lorretta Harp, MD sent at 08/30/2015  1:41 PM EDT ----- Return office visit with me to discuss results at next available

## 2015-08-30 NOTE — Telephone Encounter (Signed)
Spoke with pt she aware of lab. Pt stated that she having swelling in her ankles but feeling much better and more energy. BP is better for her, checking it regularly, 147/80. Reviewed with PharmD. Orders for pt to start Spirolactone 25 mg by mouth once daily and decrease Norvasc to 2.5 mg (half tablet) by mouth once daily. Pt to keep scheduled f/u with Erasmo Downer on 5/5 in [facility] clinic. Pt also to have lab work completed (bmet) on 09/09/15.   Pt aware of medication changes and need for lab work. Pt verbalized understanding, no additional questions at this time.

## 2015-09-07 LAB — BASIC METABOLIC PANEL
BUN: 22 mg/dL (ref 7–25)
CALCIUM: 9.4 mg/dL (ref 8.6–10.4)
CO2: 28 mmol/L (ref 20–31)
Chloride: 101 mmol/L (ref 98–110)
Creat: 0.79 mg/dL (ref 0.50–0.99)
Glucose, Bld: 85 mg/dL (ref 65–99)
POTASSIUM: 4.1 mmol/L (ref 3.5–5.3)
SODIUM: 142 mmol/L (ref 135–146)

## 2015-09-08 ENCOUNTER — Ambulatory Visit (INDEPENDENT_AMBULATORY_CARE_PROVIDER_SITE_OTHER): Payer: BLUE CROSS/BLUE SHIELD | Admitting: Family Medicine

## 2015-09-08 ENCOUNTER — Ambulatory Visit (INDEPENDENT_AMBULATORY_CARE_PROVIDER_SITE_OTHER): Payer: BLUE CROSS/BLUE SHIELD

## 2015-09-08 VITALS — BP 130/86 | HR 60 | Temp 99.0°F | Resp 16 | Ht 67.75 in | Wt 181.4 lb

## 2015-09-08 DIAGNOSIS — M25531 Pain in right wrist: Secondary | ICD-10-CM

## 2015-09-08 DIAGNOSIS — Y92099 Unspecified place in other non-institutional residence as the place of occurrence of the external cause: Secondary | ICD-10-CM | POA: Diagnosis not present

## 2015-09-08 DIAGNOSIS — N3001 Acute cystitis with hematuria: Secondary | ICD-10-CM | POA: Diagnosis not present

## 2015-09-08 DIAGNOSIS — W19XXXA Unspecified fall, initial encounter: Secondary | ICD-10-CM

## 2015-09-08 DIAGNOSIS — S301XXA Contusion of abdominal wall, initial encounter: Secondary | ICD-10-CM | POA: Diagnosis not present

## 2015-09-08 DIAGNOSIS — Y92009 Unspecified place in unspecified non-institutional (private) residence as the place of occurrence of the external cause: Principal | ICD-10-CM

## 2015-09-08 LAB — POCT URINALYSIS DIP (MANUAL ENTRY)
BILIRUBIN UA: NEGATIVE
Glucose, UA: NEGATIVE
NITRITE UA: POSITIVE — AB
PH UA: 7
Spec Grav, UA: 1.015
Urobilinogen, UA: 1

## 2015-09-08 LAB — POC MICROSCOPIC URINALYSIS (UMFC)

## 2015-09-08 MED ORDER — AMOXICILLIN-POT CLAVULANATE 875-125 MG PO TABS: 1.0000 | ORAL_TABLET | Freq: Two times a day (BID) | ORAL | Status: DC

## 2015-09-08 NOTE — Patient Instructions (Addendum)
   IF you received an x-ray today, you will receive an invoice from [facility]. Please contact  Radiology at [phone] with questions or concerns regarding your invoice.   IF you received labwork today, you will receive an invoice from [facility]. Please contact [facility] at [phone] with questions or concerns regarding your invoice.   Our billing staff will not be able to assist you with questions regarding bills from these companies.  You will be contacted with the lab results as soon as they are available. The fastest way to get your results is to activate your My Chart account. Instructions are located on the last page of this paperwork. If you have not heard from us regarding the results in 2 weeks, please contact this office.      Wrist Sprain With Rehab A sprain is an injury in which a ligament that maintains the proper alignment of a joint is partially or completely torn. The ligaments of the wrist are susceptible to sprains. Sprains are classified into three categories. Grade 1 sprains cause pain, but the tendon is not lengthened. Grade 2 sprains include a lengthened ligament because the ligament is stretched or partially ruptured. With grade 2 sprains there is still function, although the function may be diminished. Grade 3 sprains are characterized by a complete tear of the tendon or muscle, and function is usually impaired. SYMPTOMS   Pain tenderness, inflammation, and/or bruising (contusion) of the injury.  A "pop" or tear felt and/or heard at the time of injury.  Decreased wrist function. CAUSES  A wrist sprain occurs when a force is placed on one or more ligaments that is greater than it/they can withstand. Common mechanisms of injury include:  Catching a ball with your hands.  Repetitive and/ or strenuous extension or flexion of the wrist. RISK INCREASES WITH:  Previous wrist injury.  Contact sports (boxing or  wrestling).  Activities in which falling is common.  Poor strength and flexibility.  Improperly fitted or padded protective equipment. PREVENTION  Warm up and stretch properly before activity.  Allow for adequate recovery between workouts.  Maintain physical fitness:  Strength, flexibility, and endurance.  Cardiovascular fitness.  Protect the wrist joint by limiting its motion with the use of taping, braces, or splints.  Protect the wrist after injury for 6 to 12 months. PROGNOSIS  The prognosis for wrist sprains depends on the degree of injury. Grade 1 sprains require 2 to 6 weeks of treatment. Grade 2 sprains require 6 to 8 weeks of treatment, and grade 3 sprains require up to 12 weeks.  RELATED COMPLICATIONS   Prolonged healing time, if improperly treated or re-injured.  Recurrent symptoms that result in a chronic problem.  Injury to nearby structures (bone, cartilage, nerves, or tendons).  Arthritis of the wrist.  Inability to compete in athletics at a high level.  Wrist stiffness or weakness.  Progression to a complete rupture of the ligament. TREATMENT  Treatment initially involves resting from any activities that aggravate the symptoms, and the use of ice and medications to help reduce pain and inflammation. Your caregiver may recommend immobilizing the wrist for a period of time in order to reduce stress on the ligament and allow for healing. After immobilization it is important to perform strengthening and stretching exercises to help regain strength and a full range of motion. These exercises may be completed at home or with a therapist. Surgery is not usually required for wrist sprains, unless the ligament has   been ruptured (grade 3 sprain). MEDICATION   If pain medication is necessary, then nonsteroidal anti-inflammatory medications, such as aspirin and ibuprofen, or other minor pain relievers, such as acetaminophen, are often recommended.  Do not take pain  medication for 7 days before surgery.  Prescription pain relievers may be given if deemed necessary by your caregiver. Use only as directed and only as much as you need. HEAT AND COLD  Cold treatment (icing) relieves pain and reduces inflammation. Cold treatment should be applied for 10 to 15 minutes every 2 to 3 hours for inflammation and pain and immediately after any activity that aggravates your symptoms. Use ice packs or massage the area with a piece of ice (ice massage).  Heat treatment may be used prior to performing the stretching and strengthening activities prescribed by your caregiver, physical therapist, or athletic trainer. Use a heat pack or soak your injury in warm water. SEEK MEDICAL CARE IF:  Treatment seems to offer no benefit, or the condition worsens.  Any medications produce adverse side effects. EXERCISES RANGE OF MOTION (ROM) AND STRETCHING EXERCISES - Wrist Sprain  These exercises may help you when beginning to rehabilitate your injury. Your symptoms may resolve with or without further involvement from your physician, physical therapist or athletic trainer. While completing these exercises, remember:   Restoring tissue flexibility helps normal motion to return to the joints. This allows healthier, less painful movement and activity.  An effective stretch should be held for at least 30 seconds.  A stretch should never be painful. You should only feel a gentle lengthening or release in the stretched tissue. RANGE OF MOTION - Wrist Flexion, Active-Assisted  Extend your right / left elbow with your fingers pointing down.*  Gently pull the back of your hand towards you until you feel a gentle stretch on the top of your forearm.  Hold this position for __________ seconds. Repeat __________ times. Complete this exercise __________ times per day.  *If directed by your physician, physical therapist or athletic trainer, complete this stretch with your elbow bent rather  than extended. RANGE OF MOTION - Wrist Extension, Active-Assisted  Extend your right / left elbow and turn your palm upwards.*  Gently pull your palm/fingertips back so your wrist extends and your fingers point more toward the ground.  You should feel a gentle stretch on the inside of your forearm.  Hold this position for __________ seconds. Repeat __________ times. Complete this exercise __________ times per day. *If directed by your physician, physical therapist or athletic trainer, complete this stretch with your elbow bent, rather than extended. RANGE OF MOTION - Supination, Active  Stand or sit with your elbows at your side. Bend your right / left elbow to 90 degrees.  Turn your palm upward until you feel a gentle stretch on the inside of your forearm.  Hold this position for __________ seconds. Slowly release and return to the starting position. Repeat __________ times. Complete this stretch __________ times per day.  RANGE OF MOTION - Pronation, Active  Stand or sit with your elbows at your side. Bend your right / left elbow to 90 degrees.  Turn your palm downward until you feel a gentle stretch on the top of your forearm.  Hold this position for __________ seconds. Slowly release and return to the starting position. Repeat __________ times. Complete this stretch __________ times per day.  STRETCH - Wrist Flexion  Place the back of your right / left hand on a tabletop leaving your elbow   slightly bent. Your fingers should point away from your body.  Gently press the back of your hand down onto the table by straightening your elbow. You should feel a stretch on the top of your forearm.  Hold this position for __________ seconds. Repeat __________ times. Complete this stretch __________ times per day.  STRETCH - Wrist Extension  Place your right / left fingertips on a tabletop leaving your elbow slightly bent. Your fingers should point backwards.  Gently press your fingers  and palm down onto the table by straightening your elbow. You should feel a stretch on the inside of your forearm.  Hold this position for __________ seconds. Repeat __________ times. Complete this stretch __________ times per day.  STRENGTHENING EXERCISES - Wrist Sprain These exercises may help you when beginning to rehabilitate your injury. They may resolve your symptoms with or without further involvement from your physician, physical therapist or athletic trainer. While completing these exercises, remember:   Muscles can gain both the endurance and the strength needed for everyday activities through controlled exercises.  Complete these exercises as instructed by your physician, physical therapist or athletic trainer. Progress with the resistance and repetition exercises only as your caregiver advises. STRENGTH - Wrist Flexors  Sit with your right / left forearm palm-up and fully supported. Your elbow should be resting below the height of your shoulder. Allow your wrist to extend over the edge of the surface.  Loosely holding a __________ weight or a piece of rubber exercise band/tubing, slowly curl your hand up toward your forearm.  Hold this position for __________ seconds. Slowly lower the wrist back to the starting position in a controlled manner. Repeat __________ times. Complete this exercise __________ times per day.  STRENGTH - Wrist Extensors  Sit with your right / left forearm palm-down and fully supported. Your elbow should be resting below the height of your shoulder. Allow your wrist to extend over the edge of the surface.  Loosely holding a __________ weight or a piece of rubber exercise band/tubing, slowly curl your hand up toward your forearm.  Hold this position for __________ seconds. Slowly lower the wrist back to the starting position in a controlled manner. Repeat __________ times. Complete this exercise __________ times per day.  STRENGTH - Ulnar Deviators  Stand  with a ____________________ weight in your right / left hand, or sit holding on to the rubber exercise band/tubing with your opposite arm supported.  Move your wrist so that your pinkie travels toward your forearm and your thumb moves away from your forearm.  Hold this position for __________ seconds and then slowly lower the wrist back to the starting position. Repeat __________ times. Complete this exercise __________ times per day STRENGTH - Radial Deviators  Stand with a ____________________ weight in your  right / left hand, or sit holding on to the rubber exercise band/tubing with your arm supported.  Raise your hand upward in front of you or pull up on the rubber tubing.  Hold this position for __________ seconds and then slowly lower the wrist back to the starting position. Repeat __________ times. Complete this exercise __________ times per day. STRENGTH - Forearm Supinators  Sit with your right / left forearm supported on a table, keeping your elbow below shoulder height. Rest your hand over the edge, palm down.  Gently grip a hammer or a soup ladle.  Without moving your elbow, slowly turn your palm and hand upward to a "thumbs-up" position.  Hold this position for   __________ seconds. Slowly return to the starting position. Repeat __________ times. Complete this exercise __________ times per day.  STRENGTH - Forearm Pronators  Sit with your right / left forearm supported on a table, keeping your elbow below shoulder height. Rest your hand over the edge, palm up.  Gently grip a hammer or a soup ladle.  Without moving your elbow, slowly turn your palm and hand upward to a "thumbs-up" position.  Hold this position for __________ seconds. Slowly return to the starting position. Repeat __________ times. Complete this exercise __________ times per day.  STRENGTH - Grip  Grasp a tennis ball, a dense sponge, or a large, rolled sock in your hand.  Squeeze as hard as you can  without increasing any pain.  Hold this position for __________ seconds. Release your grip slowly. Repeat __________ times. Complete this exercise __________ times per day.    This information is not intended to replace advice given to you by your health care provider. Make sure you discuss any questions you have with your health care provider.   Document Released: 04/27/2005 Document Revised: 01/16/2015 Document Reviewed: 08/09/2008 Elsevier Interactive Patient Education 2016 [employer]  

## 2015-09-08 NOTE — Progress Notes (Signed)
Subjective:    Patient ID: Pamela Reynolds, female    DOB: 02/06/1956, 60 y.o.   MRN: [id number] By signing my name below, I, Pamela Reynolds, attest that this documentation has been prepared under the direction and in the presence of Delman Cheadle, MD. Electronically Signed: Judithe Reynolds, ER Scribe. 09/08/2015. 4:25 PM.  Chief Complaint  Patient presents with  . fall    landed on right side, hip bruised, right hand and arm, 09/07/15    HPI  HPI Comments: Pamela Reynolds is a 60 y.o. female who presents to [facility] complaining of injuries from falling out of bed last night. She has pain in her right wrist, and some bruising and tenderness on her right hip and shoulder. She tried to crawl over her husband and misjudged the distance to the ground from the bed. She has been slightly nauseous today. No changes in BM or urination. She has a slight limp today. She takes one aleve every morning.   She also mentions that her urine has had a funny odor for the last month.    Past Medical History  Diagnosis Date  . History of chicken pox   . Cardiac dysrhythmia, unspecified In distant past    S/P workup, was told this was benign  . Special screening for malignant neoplasms, colon   . Persistent cough     with URI, prev. trx'd with Hycodan  . NSVD (normal spontaneous vaginal delivery) 1979 & [id number]    No complications  . Shortness of breath    Allergies  Allergen Reactions  . Amlodipine Besylate     REACTION: edema and rash on bilateral ankles  . Lisinopril     REACTION: presumed ACE cough   Current Outpatient Prescriptions on File Prior to Visit  Medication Sig Dispense Refill  . amLODipine (NORVASC) 5 MG tablet Take 0.5 tablets (2.5 mg total) by mouth daily. 180 tablet 3  . citalopram (CELEXA) 20 MG tablet TAKE 1/2 TO 1 TABLET BY MOUTH ONCE A DAY 90 tablet 1  . cloNIDine (CATAPRES) 0.1 MG tablet TAKE 1 TABLET BY MOUTH TWICE DAILY. 180 tablet 3  . losartan-hydrochlorothiazide (HYZAAR) 100-25 MG per  tablet TAKE 1 TABLET BY MOUTH DAILY. 90 tablet 3  . metoprolol (LOPRESSOR) 50 MG tablet TAKE 1 TABLET BY MOUTH 2 TIMES A DAY. 180 tablet 3  . naproxen sodium (ANAPROX) 220 MG tablet Take 220 mg by mouth daily.    Marland Kitchen spironolactone (ALDACTONE) 25 MG tablet Take 1 tablet (25 mg total) by mouth daily. 90 tablet 3  . ALPRAZolam (XANAX) 0.5 MG tablet Take 1 tablet (0.5 mg total) by mouth 2 (two) times daily as needed for anxiety. (Patient not taking: Reported on 09/08/2015) 30 tablet 1  . Multiple Vitamin (MULTIVITAMIN WITH MINERALS) TABS tablet Take 1 tablet by mouth daily. Reported on 09/08/2015    . valACYclovir (VALTREX) 500 MG tablet Take 1 tablet (500 mg total) by mouth 2 (two) times daily. For 3-5 days as needed (Patient not taking: Reported on 09/08/2015) 30 tablet 12   No current facility-administered medications on file prior to visit.    Review of Systems  Constitutional: Negative for fever and chills.  Musculoskeletal: Positive for myalgias and arthralgias.  Skin: Positive for color change. Negative for wound.  Neurological: Positive for dizziness. Negative for headaches.      Objective:  BP 130/86 mmHg  Pulse 60  Temp(Src) 99 F (37.2 C) (Oral)  Resp 16  Ht 5' 7.75" (1.721  m)  Wt 181 lb 6.4 oz (82.283 kg)  BMI 27.78 kg/m2  SpO2 97%  Physical Exam  Constitutional: She is oriented to person, place, and time. She appears well-developed and well-nourished. No distress.  HENT:  Head: Normocephalic and atraumatic.  Eyes: Pupils are equal, round, and reactive to light.  Neck: Neck supple.  Cardiovascular: Normal rate.   Pulmonary/Chest: Effort normal. No respiratory distress.  Abdominal: Soft. Bowel sounds are normal. She exhibits no distension. There is no tenderness.  Normal bowel sounds.  Musculoskeletal: Normal range of motion.  No tenderness over radial or ulnar styloid. No TTP over anatomical snuff box. Development of bruising over thenar eminence. Palmar aspect on the  radial aspect of the hand TTP. Full ROM of wrist, full flexion and rotation. 5/5 strength. Full ROM of thumb.   Right flank tenderness and ecchymosis. As well as the lateral aspect of the upper arm. Full ROM in shoulders and elbows.No tenderness over SI joints, greater trochanter, or spinous processes. 2+ patellar and achilles DTRs.   Neurological: She is alert and oriented to person, place, and time. Coordination normal.  Skin: Skin is warm and dry. She is not diaphoretic.  Psychiatric: She has a normal mood and affect. Her behavior is normal.  Nursing note and vitals reviewed.    Dg Wrist Complete Right  09/08/2015  CLINICAL DATA:  Fall, right wrist pain EXAM: RIGHT WRIST - COMPLETE 3+ VIEW COMPARISON:  None. FINDINGS: No fracture or dislocation is seen. Mild degenerative changes of the radiocarpal joint. Moderate degenerative changes of the 1st carpometacarpal joint. The visualized soft tissues are unremarkable. IMPRESSION: No fracture or dislocation is seen. Mild to moderate degenerative changes. Electronically Signed   By: Julian Hy M.D.   On: 09/08/2015 17:10   Mr Abdomen Wo Contrast  08/23/2015  CLINICAL DATA:  60 year old female with hypertension, fatigue and weakness. Reported history of abnormal renal ultrasound. EXAM: MRI ABDOMEN WITHOUT CONTRAST TECHNIQUE: Multiplanar multisequence MR imaging was performed without the administration of intravenous contrast. COMPARISON:  No relevant priors are available at the time of this dictation. FINDINGS: Lower chest: Clear lung bases. Hepatobiliary: There is focal fat in the lateral segment left liver lobe (series 9/image 14). Otherwise normal liver with no evidence of diffuse hepatic steatosis. No liver mass. Cholecystectomy. Minimal central intrahepatic biliary ductal dilatation, within expected post cholecystectomy limits. Common bile duct diameter 9 mm, within expected post cholecystectomy limits. No choledocholithiasis. Pancreas: No  pancreatic mass or duct dilation. There is pancreas divisum (main pancreatic duct drains via an accessory duct of [place], with no evidence of communication of the main pancreatic duct with the common bile duct). Spleen: Normal size. No mass. Adrenals/Urinary Tract: Normal adrenals. No hydronephrosis. There a few simple appearing renal cysts in both kidneys measuring up to 1.7 cm in the medial upper right kidney and 1.5 cm in the lower left kidney, incompletely evaluated on this noncontrast study. No overtly suspicious renal masses on this noncontrast study. Stomach/Bowel: Grossly normal stomach. Visualized small and large bowel is normal caliber, with no bowel wall thickening. Vascular/Lymphatic: Normal caliber abdominal aorta. No pathologically enlarged lymph nodes in the abdomen. Other: No abdominal ascites or focal fluid collection. Musculoskeletal: No aggressive appearing focal osseous lesions. IMPRESSION: 1. Small simple appearing renal cysts in both kidneys. No overtly suspicious renal masses on this noncontrast study. No hydronephrosis. 2. Pancreas divisum. 3. Focal fat in the left liver lobe.  Otherwise normal liver. 4. Cholecystectomy. Bile ducts are within expected post cholecystectomy limits.  Electronically Signed   By: Ilona Sorrel M.D.   On: 08/23/2015 09:35    Assessment & Plan:   1. Fall at home, initial encounter   2. Wrist pain, acute, right   3. Hematoma of right flank, initial encounter   4. Acute cystitis with hematuria   Fortunately, pt appears bruised with wrist sprain from her fall but no serious injury. Pt has a thumb spica splint for her right wrist at home so will use that for a week long with ice. RTC if pain is worsening or not resolved.  Orders Placed This Encounter  Procedures  . Urine culture  . DG Wrist Complete Right    Standing Status: Future     Number of Occurrences: 1     Standing Expiration Date: 09/07/2016    Order Specific Question:  Reason for Exam  (SYMPTOM  OR DIAGNOSIS REQUIRED)    Answer:  wrist pain, s/p fall (follow-up)    Order Specific Question:  Preferred imaging location?    Answer:  External  . POCT Microscopic Urinalysis ([facility])  . POCT urinalysis dipstick    Meds ordered this encounter  Medications  . amoxicillin-clavulanate (AUGMENTIN) 875-125 MG tablet    Sig: Take 1 tablet by mouth 2 (two) times daily.    Dispense:  14 tablet    Refill:  0    I personally performed the services described in this documentation, which was scribed in my presence. The recorded information has been reviewed and considered, and addended by me as needed.  Delman Cheadle, MD MPH

## 2015-09-11 LAB — URINE CULTURE

## 2015-09-13 ENCOUNTER — Encounter: Payer: Self-pay | Admitting: Pharmacist Clinician (PhC)/ Clinical Pharmacy Specialist

## 2015-09-13 ENCOUNTER — Ambulatory Visit (INDEPENDENT_AMBULATORY_CARE_PROVIDER_SITE_OTHER): Payer: BLUE CROSS/BLUE SHIELD | Admitting: Pharmacist Clinician (PhC)/ Clinical Pharmacy Specialist

## 2015-09-13 VITALS — BP 118/88 | HR 64 | Ht 70.0 in | Wt 182.2 lb

## 2015-09-13 DIAGNOSIS — I10 Essential (primary) hypertension: Secondary | ICD-10-CM | POA: Diagnosis not present

## 2015-09-13 NOTE — Assessment & Plan Note (Signed)
Today her blood pressure in the office is quite good at 118/88.  She normally checks home reading in late afternoon, never in mornings.  I am going to have her stop the amlodipine altogether and move the spironolactone to evenings. She will check her BP at home daily, about half of those readings should be in the mornings.  She was encouraged to start walking daily for at least 15-30 minutes.  We will see her back in a month for follow up.

## 2015-09-13 NOTE — Patient Instructions (Signed)
Return for a a follow up appointment in 1 month  Your blood pressure today is 118/88  (goal is to stay < 140/90)  Check your blood pressure at home daily and keep record of the readings.  Take your BP meds as follows: stop amlodipine for now.  Move spironolactone to evenings, continue with all other meds   Bring all of your meds, your BP cuff and your record of home blood pressures to your next appointment.  Exercise as you're able, try to walk approximately 30 minutes per day.  Keep salt intake to a minimum, especially watch canned and prepared boxed foods.  Eat more fresh fruits and vegetables and fewer canned items.  Avoid eating in fast food restaurants.    HOW TO TAKE YOUR BLOOD PRESSURE: . Rest 5 minutes before taking your blood pressure. .  Don't smoke or drink caffeinated beverages for at least 30 minutes before. . Take your blood pressure before (not after) you eat. . Sit comfortably with your back supported and both feet on the floor (don't cross your legs). . Elevate your arm to heart level on a table or a desk. . Use the proper sized cuff. It should fit smoothly and snugly around your bare upper arm. There should be enough room to slip a fingertip under the cuff. The bottom edge of the cuff should be 1 inch above the crease of the elbow. . Ideally, take 3 measurements at one sitting and record the average.

## 2015-09-13 NOTE — Progress Notes (Signed)
09/13/2015 Pamela Reynolds 1956/02/23 [id number]   HPI:  Pamela Reynolds is a 60 y.o. female patient of Dr Gwenlyn Found, with a PMH below who presents today for hypertension clinic evaluation.  She has a long history of hypertension, going back to when she was in her 25's.  It was usually noted during stressful or traumatic times, then would work back to normal.  It was about 10 years ago that she started taking medications.  A renal artery doppler in March showed no sign of blockage.    Cardiac Hx: grade 1 diastolic dysfunction, hypertension  Family Hx: mother just started taking meds for hypertension at 52; son has started to notice increase in pressures, but not currently on medication  Social Hx: no tobacco; rare alcohol, maximum of 1 cup of coffee per day  Diet: cooks most foods at home, using fresh ingredients, no boxed or frozen prepared foods.  No canned foods.  Does not add salt when cooking or at table  Exercise: none  Home BP readings: did not bring cuff today, home readings as high as [id number] systolic and [id number] diastolic.  Average was 153/88.    Current antihypertensive medications: amlodipine 2.5 mg qam, losartan hctz 100/25 mg qam, spironolactone 25 mg qam, metoprolol 50 mg bid, clonidine 0.1 mg bid  Intolerances: higher doses of amlodipine cause LEE, lisinopril caused cough  Wt Readings from Last 3 Encounters:  09/13/15 182 lb 3.2 oz (82.645 kg)  09/08/15 181 lb 6.4 oz (82.283 kg)  08/16/15 183 lb 9.6 oz (83.28 kg)   BP Readings from Last 3 Encounters:  09/13/15 118/88  09/08/15 130/86  08/16/15 138/90   Pulse Readings from Last 3 Encounters:  09/13/15 64  09/08/15 60  08/16/15 58    Current Outpatient Prescriptions  Medication Sig Dispense Refill  . amLODipine (NORVASC) 5 MG tablet Take 0.5 tablets (2.5 mg total) by mouth daily. 180 tablet 3  . amoxicillin-clavulanate (AUGMENTIN) 875-125 MG tablet Take 1 tablet by mouth 2 (two) times daily. 14 tablet 0  . citalopram (CELEXA)  20 MG tablet TAKE 1/2 TO 1 TABLET BY MOUTH ONCE A DAY 90 tablet 1  . cloNIDine (CATAPRES) 0.1 MG tablet TAKE 1 TABLET BY MOUTH TWICE DAILY. 180 tablet 3  . losartan-hydrochlorothiazide (HYZAAR) 100-25 MG per tablet TAKE 1 TABLET BY MOUTH DAILY. 90 tablet 3  . metoprolol (LOPRESSOR) 50 MG tablet TAKE 1 TABLET BY MOUTH 2 TIMES A DAY. 180 tablet 3  . Multiple Vitamin (MULTIVITAMIN WITH MINERALS) TABS tablet Take 1 tablet by mouth daily. Reported on 09/08/2015    . naproxen sodium (ANAPROX) 220 MG tablet Take 220 mg by mouth daily.    Marland Kitchen spironolactone (ALDACTONE) 25 MG tablet Take 1 tablet (25 mg total) by mouth daily. 90 tablet 3   No current facility-administered medications for this visit.    Allergies  Allergen Reactions  . Amlodipine Besylate     REACTION: edema and rash on bilateral ankles  . Lisinopril     REACTION: presumed ACE cough    Past Medical History  Diagnosis Date  . History of chicken pox   . Cardiac dysrhythmia, unspecified In distant past    S/P workup, was told this was benign  . Special screening for malignant neoplasms, colon   . Persistent cough     with URI, prev. trx'd with Hycodan  . NSVD (normal spontaneous vaginal delivery) 1979 & [id number]    No complications  . Shortness of breath  Blood pressure 118/88, pulse 64, height 5\' 10"  (1.778 m), weight 182 lb 3.2 oz (82.645 kg).    Tommy Medal PharmD CPP [facility]

## 2015-10-01 ENCOUNTER — Ambulatory Visit (INDEPENDENT_AMBULATORY_CARE_PROVIDER_SITE_OTHER): Payer: BLUE CROSS/BLUE SHIELD

## 2015-10-01 ENCOUNTER — Ambulatory Visit (HOSPITAL_BASED_OUTPATIENT_CLINIC_OR_DEPARTMENT_OTHER)
Admission: RE | Admit: 2015-10-01 | Discharge: 2015-10-01 | Disposition: A | Discharge status: Home / Self Care | Payer: BLUE CROSS/BLUE SHIELD | Source: Ambulatory Visit | Attending: Physician Assistant | Admitting: Physician Assistant

## 2015-10-01 ENCOUNTER — Ambulatory Visit (INDEPENDENT_AMBULATORY_CARE_PROVIDER_SITE_OTHER): Payer: BLUE CROSS/BLUE SHIELD | Admitting: Physician Assistant

## 2015-10-01 ENCOUNTER — Telehealth: Payer: Self-pay

## 2015-10-01 VITALS — BP 144/79 | HR 57 | Temp 97.8°F | Resp 16 | Ht 66.0 in | Wt 182.8 lb

## 2015-10-01 DIAGNOSIS — N3001 Acute cystitis with hematuria: Secondary | ICD-10-CM | POA: Diagnosis not present

## 2015-10-01 DIAGNOSIS — R350 Frequency of micturition: Secondary | ICD-10-CM

## 2015-10-01 DIAGNOSIS — M25562 Pain in left knee: Secondary | ICD-10-CM | POA: Diagnosis not present

## 2015-10-01 DIAGNOSIS — Y999 Unspecified external cause status: Secondary | ICD-10-CM | POA: Diagnosis not present

## 2015-10-01 DIAGNOSIS — S9032XA Contusion of left foot, initial encounter: Secondary | ICD-10-CM | POA: Insufficient documentation

## 2015-10-01 DIAGNOSIS — Y929 Unspecified place or not applicable: Secondary | ICD-10-CM | POA: Insufficient documentation

## 2015-10-01 DIAGNOSIS — Y939 Activity, unspecified: Secondary | ICD-10-CM | POA: Diagnosis not present

## 2015-10-01 DIAGNOSIS — W19XXXA Unspecified fall, initial encounter: Secondary | ICD-10-CM | POA: Insufficient documentation

## 2015-10-01 DIAGNOSIS — M7989 Other specified soft tissue disorders: Secondary | ICD-10-CM | POA: Insufficient documentation

## 2015-10-01 LAB — POCT URINALYSIS DIP (MANUAL ENTRY)
BILIRUBIN UA: NEGATIVE
Bilirubin, UA: NEGATIVE
Glucose, UA: NEGATIVE
Nitrite, UA: NEGATIVE
PH UA: 6
PROTEIN UA: NEGATIVE
Spec Grav, UA: 1.02
UROBILINOGEN UA: 0.2

## 2015-10-01 LAB — POC MICROSCOPIC URINALYSIS (UMFC): Mucus: ABSENT

## 2015-10-01 MED ORDER — RIVAROXABAN (XARELTO) VTE STARTER PACK (15 & 20 MG): ORAL_TABLET | ORAL | Status: DC

## 2015-10-01 MED ORDER — NITROFURANTOIN MONOHYD MACRO 100 MG PO CAPS: 100.0000 mg | ORAL_CAPSULE | Freq: Two times a day (BID) | ORAL | Status: DC

## 2015-10-01 MED ORDER — RIVAROXABAN 20 MG PO TABS: 20.0000 mg | ORAL_TABLET | Freq: Every day | ORAL | Status: DC

## 2015-10-01 NOTE — Patient Instructions (Addendum)
Go to [facility] to have your US done. You will register through the ER as an outpatient.     IF you received an x-ray today, you will receive an invoice from [facility]. Please contact [facility] at [phone] with questions or concerns regarding your invoice.   IF you received labwork today, you will receive an invoice from [facility]. Please contact [facility] at [phone] with questions or concerns regarding your invoice.   Our billing staff will not be able to assist you with questions regarding bills from these companies.  You will be contacted with the lab results as soon as they are available. The fastest way to get your results is to activate your My Chart account. Instructions are located on the last page of this paperwork. If you have not heard from [place] regarding the results in 2 weeks, please contact this office.

## 2015-10-01 NOTE — Telephone Encounter (Signed)
Form was handed to me and I completed from the visit that I did for pt on 4/20 - then saw that the form was actually for today 5/23 - pt was seen by Carlis Abbott today - need to copy the info I wrote onto the 5/23 form onto the 4/20 form, then white out the 5/23 form and complete using diagnosis from 5/23 visit with Clark's signature.

## 2015-10-01 NOTE — Telephone Encounter (Signed)
Pt dropped off insurance form to be signed by Dr. Brigitte Pulse.  Dr. Brigitte Pulse saw pt on 09/08/15 after fall. Form is regarding fall.  I have placed forms in Dr. Raul Del box.

## 2015-10-01 NOTE — Progress Notes (Signed)
10/06/2015 11:35 AM   DOB: 09-07-1955 / MRN: [id number]  SUBJECTIVE:  Pamela Reynolds is a 60 y.o. female presenting for for the evaluation of cloudy malordorous urine which started 3 days ago.  Associated symptoms include no symptoms and the patient denies. The patients has tried nothing for these symptoms.   She also complains of a fall last Sunday in which she fell roughly 1 foot.  Her left leg got caught up and was injured as a result.  She complains mostly of leg swelling that started yesterday, however the entire leg is bruised and is painful.  She could walk after the fall.       She is allergic to amlodipine besylate and lisinopril.   She  has a past medical history of History of chicken pox; Cardiac dysrhythmia, unspecified (In distant past); Special screening for malignant neoplasms, colon; Persistent cough; NSVD (normal spontaneous vaginal delivery) (1979 & 1980); and Shortness of breath.    She  reports that she has never smoked. She does not have any smokeless tobacco history on file. She reports that she drinks alcohol. She reports that she does not use illicit drugs. She  reports that she currently engages in sexual activity. The patient  has past surgical history that includes ovarian cysts (1973); Appendectomy (1973); Breast biopsy (1993); Cholecystectomy (2004); BTL (1983); Foot neuroma surgery; Oophorectomy; Colposcopy; Co2 laser application; and Tubal ligation.  Her family history includes Heart disease in her father, maternal grandmother, paternal grandfather, and paternal grandmother; Hypertension in her mother and paternal grandmother. There is no history of Colon cancer or Breast cancer.  Review of Systems  Constitutional: Negative for fever and chills.  Eyes: Negative for blurred vision.  Respiratory: Negative for cough and shortness of breath.   Cardiovascular: Negative for chest pain.  Gastrointestinal: Negative for nausea and abdominal pain.  Genitourinary: Negative  for dysuria, urgency and frequency.  Musculoskeletal: Negative for myalgias.  Skin: Negative for rash.  Neurological: Negative for dizziness, tingling and headaches.  Psychiatric/Behavioral: Negative for depression. The patient is not nervous/anxious.     Problem list and medications reviewed and updated by myself where necessary, and exist elsewhere in the encounter.   OBJECTIVE:  BP 144/79 mmHg  Pulse 57  Temp(Src) 97.8 F (36.6 C) (Oral)  Resp 16  Ht 5\' 6"  (1.676 m)  Wt 182 lb 12.8 oz (82.918 kg)  BMI 29.52 kg/m2  SpO2 97%  Physical Exam  Constitutional: She is oriented to person, place, and time. She appears well-nourished. No distress.  Eyes: EOM are normal. Pupils are equal, round, and reactive to light.  Cardiovascular: Normal rate.   Pulmonary/Chest: Effort normal.  Abdominal: She exhibits no distension.  Musculoskeletal:       Legs: Neurological: She is alert and oriented to person, place, and time. No cranial nerve deficit. Gait normal.  Skin: Skin is dry. She is not diaphoretic.  Psychiatric: She has a normal mood and affect.  Vitals reviewed.  Results for orders placed or performed in visit on 10/01/15  POCT urinalysis dipstick  Result Value Ref Range   Color, UA yellow yellow   Clarity, UA clear clear   Glucose, UA negative negative   Bilirubin, UA negative negative   Ketones, POC UA negative negative   Spec Grav, UA 1.020    Blood, UA trace-intact (A) negative   pH, UA 6.0    Protein Ur, POC negative negative   Urobilinogen, UA 0.2    Nitrite, UA Negative Negative  Leukocytes, UA moderate (2+) (A) Negative  POCT Microscopic Urinalysis ([facility])  Result Value Ref Range   WBC,UR,HPF,POC Few (A) None WBC/hpf   RBC,UR,HPF,POC None None RBC/hpf   Bacteria Moderate (A) None, Too numerous to count   Mucus Absent Absent   Epithelial Cells, UR Per Microscopy Few (A) None, Too numerous to count cells/hpf   Dg Wrist Complete Right  09/08/2015  CLINICAL DATA:   Fall, right wrist pain EXAM: RIGHT WRIST - COMPLETE 3+ VIEW COMPARISON:  None. FINDINGS: No fracture or dislocation is seen. Mild degenerative changes of the radiocarpal joint. Moderate degenerative changes of the 1st carpometacarpal joint. The visualized soft tissues are unremarkable. IMPRESSION: No fracture or dislocation is seen. Mild to moderate degenerative changes. Electronically Signed   By: Julian Hy M.D.   On: 09/08/2015 17:10   Dg Tibia/fibula Left  10/01/2015  CLINICAL DATA:  Fall with left lower leg bruising and swelling. EXAM: LEFT TIBIA AND FIBULA - 2 VIEW COMPARISON:  None. FINDINGS: Diffuse soft tissue swelling over the left ankle. Again noted is a tiny fragment over the lateral aspect of the ankle mortise which may be due to old injury versus small acute fibular chip fracture. Remainder the exam is unremarkable. IMPRESSION: Tiny fragment over the lateral ankle mortise which may be due to old injury versus small acute chip fracture from the adjacent fibula. Electronically Signed   By: Marin Olp M.D.   On: 10/01/2015 19:10   Dg Ankle Complete Left  10/01/2015  CLINICAL DATA:  Fall with left ankle swelling and bruising. EXAM: LEFT ANKLE COMPLETE - 3+ VIEW COMPARISON:  None. FINDINGS: Soft tissue swelling over the ankle. Ankle mortise is within normal. Possible tiny fragment of the lateral aspect of the ankle mortise which may represent a small chip fracture from the adjacent fibula versus chronic finding. Remainder of the exam is unremarkable. IMPRESSION: Diffuse soft tissue swelling over the ankle. Tiny fragment along the lateral aspect of the ankle mortise which may be due to old injury versus small acute chip fracture from the adjacent fibula. Electronically Signed   By: Marin Olp M.D.   On: 10/01/2015 19:09   US Venous Img Lower Unilateral Left  10/01/2015  CLINICAL DATA:  Acute onset of left shin injury, with swelling. Initial encounter. EXAM: LEFT LOWER EXTREMITY VENOUS  DOPPLER ULTRASOUND TECHNIQUE: Gray-scale sonography with graded compression, as well as color Doppler and duplex ultrasound were performed to evaluate the lower extremity deep venous systems from the level of the common femoral vein and including the common femoral, femoral, profunda femoral, popliteal and calf veins including the posterior tibial, peroneal and gastrocnemius veins when visible. The superficial great saphenous vein was also interrogated. Spectral Doppler was utilized to evaluate flow at rest and with distal augmentation maneuvers in the common femoral, femoral and popliteal veins. COMPARISON:  None. FINDINGS: Contralateral Common Femoral Vein: Respiratory phasicity is normal and symmetric with the symptomatic side. No evidence of thrombus. Normal compressibility. Common Femoral Vein: No evidence of thrombus. Normal compressibility, respiratory phasicity and response to augmentation. Saphenofemoral Junction: No evidence of thrombus. Normal compressibility and flow on color Doppler imaging. Profunda Femoral Vein: No evidence of thrombus. Normal compressibility and flow on color Doppler imaging. Femoral Vein: No evidence of thrombus. Normal compressibility, respiratory phasicity and response to augmentation. Popliteal Vein: No evidence of thrombus. Normal compressibility, respiratory phasicity and response to augmentation. Calf Veins: No evidence of thrombus. Normal compressibility and flow on color Doppler imaging. Superficial Great Saphenous Vein: No  evidence of thrombus. Normal compressibility and flow on color Doppler imaging. Venous Reflux:  None. Other Findings: Severe bruising is noted at the left mid calf, extending to the foot. IMPRESSION: 1. No evidence of deep venous thrombosis. 2. Severe bruising noted along the left mid calf, extending to the foot. Electronically Signed   By: Garald Balding M.D.   On: 10/01/2015 21:06   Dg Foot Complete Left  10/01/2015  CLINICAL DATA:  Recent fall with  ankle pain and swelling EXAM: LEFT FOOT - COMPLETE 3+ VIEW COMPARISON:  None. FINDINGS: There is a well corticated bony density adjacent to the distal aspect of the fifth proximal phalanx consistent with prior trauma and nonhealing. No acute fracture or dislocation is seen. No gross soft tissue abnormality is noted. IMPRESSION: No acute abnormality seen. Electronically Signed   By: Inez Catalina M.D.   On: 10/01/2015 19:02      No results found for this or any previous visit (from the past 48 hour(s)).  ASSESSMENT AND PLAN:  Nolan was seen today for fall.  Diagnoses and all orders for this visit:  Urinary frequency -     POCT urinalysis dipstick -     POCT Microscopic Urinalysis ([facility])  Left leg swelling -     Cancel: VAS [place] LOWER EXTREMITY VENOUS (DVT); Future -     US Venous Img Lower Unilateral Left; Future  Arthralgia of left lower leg -     DG Ankle Complete Left; Future -     DG Foot Complete Left; Future -     DG Tibia/Fibula Left; Future  Acute cystitis with hematuria -     nitrofurantoin, macrocrystal-monohydrate, (MACROBID) 100 MG capsule; Take 1 capsule (100 mg total) by mouth 2 (two) times daily.  Other orders -     Discontinue: Rivaroxaban 15 & 20 MG TBPK; Take as directed on package: Start with one 15mg  tablet by mouth twice a day with food. On Day 22, switch to one 20mg  tablet once a day with food. -     Discontinue: rivaroxaban (XARELTO) 20 MG TABS tablet; Take 1 tablet (20 mg total) by mouth daily with supper.    The patient was advised to call or return to clinic if she does not see an improvement in symptoms or to seek the care of the closest emergency department if she worsens with the above plan.   Philis Fendt, MHS, PA-C [facility] 10/06/2015 11:35 AM

## 2015-10-03 NOTE — Telephone Encounter (Signed)
Requesting new form now. I could not find white out anywhere in this building.

## 2015-10-08 NOTE — Telephone Encounter (Signed)
Legrand Como did Jocelyn give this form to you on Friday?

## 2015-10-11 ENCOUNTER — Encounter: Payer: Self-pay | Admitting: Pharmacist Clinician (PhC)/ Clinical Pharmacy Specialist

## 2015-10-11 ENCOUNTER — Ambulatory Visit (INDEPENDENT_AMBULATORY_CARE_PROVIDER_SITE_OTHER): Payer: BLUE CROSS/BLUE SHIELD | Admitting: Pharmacist Clinician (PhC)/ Clinical Pharmacy Specialist

## 2015-10-11 VITALS — BP 118/74 | Ht 70.0 in | Wt 179.0 lb

## 2015-10-11 DIAGNOSIS — I10 Essential (primary) hypertension: Secondary | ICD-10-CM

## 2015-10-11 MED ORDER — VALSARTAN-HYDROCHLOROTHIAZIDE 160-25 MG PO TABS: 1.0000 | ORAL_TABLET | Freq: Every day | ORAL | Status: DC

## 2015-10-11 NOTE — Assessment & Plan Note (Signed)
Because of her persistent cough with losartan (and previous cough with ACEIs) will have her switch to valsartan hctz 160/25 once daily.  She will monitor home BP readings and if still mostly WNL she will discontinue the evening clonidine dose.  If after another week she is still doing well she can discontinue clonidine altogether.  We talked about potentially increasing the valsartan to 320 mg and getting rid of the clonidine completely, but will do this in stages.  She will return for follow up in 6 weeks

## 2015-10-11 NOTE — Progress Notes (Signed)
10/11/2015 Pamela Reynolds 1955/05/30 [id number]   HPI:  Pamela Reynolds is a 60 y.o. female patient of Dr Gwenlyn Found, with a PMH below who presents today for hypertension clinic follow up.  She has a long history of hypertension, going back to when she was in her 23's.  It was usually noted during stressful or traumatic times, then would work back to normal.  For the past 10 years she has been taking medications and done fairly well.  A renal artery doppler in March showed no sign of blockages.  At her last visit we made no changes, but asked her to continue regular monitoring and bring her cuff back into the office today for verification.    May was a stressful month for her, her mother in law, who lives about 2 hours away, was diagnosed with pancreatic cancer and they have been driving back and forth to see her and take her to appointments.  Also, she had a persistent cough and finally had a chest x-ray that showed a "spot". She was given a Z-pak and tessalon perles to clear it up, but will need a follow up to be sure that it goes away.  There have been some reports of losartan causing persistent cough similar to ACE inhibitors, so we discussed taking her off losartan just to eliminate another potential cause of her coughing.  She is scheduled for joint replacement surgery on both thumbs (about 4-6 weeks apart) later this month.  Cardiac Hx: grade 1 diastolic dysfunction, hypertension  Family Hx: mother just started taking meds for hypertension at 22; son has started to notice increase in pressures, but not currently on medication  Social Hx: no tobacco; rare alcohol, maximum of 1 cup of coffee per day  Diet: cooks most foods at home, using fresh ingredients, no boxed or frozen prepared foods.  No canned foods.  Does not add salt when cooking or at table; has been eating even better, as they are cooking meals to take to her mother-in-law on a regular basis  Exercise: none  Home BP readings: home cuff  [employer] by [employer] about 61 years old, home readings as high as [id number] systolic and 123456 diastolic.  Average was 130/88 .   Read accurate to office cuff within 5 points  Current antihypertensive medications: losartan hctz 100/25 mg qam, spironolactone 25 mg qam, metoprolol 50 mg bid, clonidine 0.1 mg bid  Intolerances: higher doses of amlodipine cause LEE, lisinopril caused cough  Wt Readings from Last 3 Encounters:  10/11/15 179 lb (81.194 kg)  10/01/15 182 lb 12.8 oz (82.918 kg)  09/13/15 182 lb 3.2 oz (82.645 kg)   BP Readings from Last 3 Encounters:  10/11/15 118/74  10/01/15 144/79  09/13/15 118/88   Pulse Readings from Last 3 Encounters:  10/01/15 57  09/13/15 64  09/08/15 60    Current Outpatient Prescriptions  Medication Sig Dispense Refill  . amoxicillin-clavulanate (AUGMENTIN) 875-125 MG tablet Take 1 tablet by mouth 2 (two) times daily. (Patient not taking: Reported on 10/01/2015) 14 tablet 0  . citalopram (CELEXA) 20 MG tablet TAKE 1/2 TO 1 TABLET BY MOUTH ONCE A DAY 90 tablet 1  . cloNIDine (CATAPRES) 0.1 MG tablet TAKE 1 TABLET BY MOUTH TWICE DAILY. 180 tablet 3  . losartan-hydrochlorothiazide (HYZAAR) 100-25 MG per tablet TAKE 1 TABLET BY MOUTH DAILY. 90 tablet 3  . metoprolol (LOPRESSOR) 50 MG tablet TAKE 1 TABLET BY MOUTH 2 TIMES A DAY. 180 tablet 3  . Multiple  Vitamin (MULTIVITAMIN WITH MINERALS) TABS tablet Take 1 tablet by mouth daily. Reported on 09/08/2015    . naproxen sodium (ANAPROX) 220 MG tablet Take 220 mg by mouth daily.    . nitrofurantoin, macrocrystal-monohydrate, (MACROBID) 100 MG capsule Take 1 capsule (100 mg total) by mouth 2 (two) times daily. 20 capsule 0  . spironolactone (ALDACTONE) 25 MG tablet Take 1 tablet (25 mg total) by mouth daily. 90 tablet 3  . valsartan-hydrochlorothiazide (DIOVAN-HCT) 160-25 MG tablet Take 1 tablet by mouth daily. 30 tablet 6   No current facility-administered medications for this visit.    Allergies  Allergen Reactions    . Amlodipine Besylate     REACTION: edema and rash on bilateral ankles  . Lisinopril     REACTION: presumed ACE cough    Past Medical History  Diagnosis Date  . History of chicken pox   . Cardiac dysrhythmia, unspecified In distant past    S/P workup, was told this was benign  . Special screening for malignant neoplasms, colon   . Persistent cough     with URI, prev. trx'd with Hycodan  . NSVD (normal spontaneous vaginal delivery) 1979 & [id number]    No complications  . Shortness of breath     Blood pressure 118/74, height 5\' 10"  (1.778 m), weight 179 lb (81.194 kg).    Tommy Medal PharmD CPP [facility]

## 2015-10-11 NOTE — Patient Instructions (Signed)
Switch losartan hctz to valsartan hctz 160/25 mg once daily.  If you BP stays stable, then after 2 weeks stop evening clonidine dose.  If after another week you BP is looking good, then stop the morning clonidine.  We can increase the valsartan dose rather than re-start the clonidine should your BP go up.  Your blood pressure today is 118/74  Check your blood pressure at home several days per week and keep record of the readings.  Bring all of your meds, your BP cuff and your record of home blood pressures to your next appointment.  Exercise as you're able, try to walk approximately 30 minutes per day.  Keep salt intake to a minimum, especially watch canned and prepared boxed foods.  Eat more fresh fruits and vegetables and fewer canned items.  Avoid eating in fast food restaurants.    HOW TO TAKE YOUR BLOOD PRESSURE: . Rest 5 minutes before taking your blood pressure. .  Don't smoke or drink caffeinated beverages for at least 30 minutes before. . Take your blood pressure before (not after) you eat. . Sit comfortably with your back supported and both feet on the floor (don't cross your legs). . Elevate your arm to heart level on a table or a desk. . Use the proper sized cuff. It should fit smoothly and snugly around your bare upper arm. There should be enough room to slip a fingertip under the cuff. The bottom edge of the cuff should be 1 inch above the crease of the elbow. . Ideally, take 3 measurements at one sitting and record the average.

## 2015-10-18 ENCOUNTER — Ambulatory Visit (INDEPENDENT_AMBULATORY_CARE_PROVIDER_SITE_OTHER): Payer: BLUE CROSS/BLUE SHIELD | Admitting: Family Medicine

## 2015-10-18 ENCOUNTER — Encounter: Payer: Self-pay | Admitting: Family Medicine

## 2015-10-18 VITALS — BP 113/75 | HR 66 | Temp 98.3°F | Ht 66.75 in | Wt 181.3 lb

## 2015-10-18 DIAGNOSIS — R05 Cough: Secondary | ICD-10-CM | POA: Diagnosis not present

## 2015-10-18 DIAGNOSIS — I10 Essential (primary) hypertension: Secondary | ICD-10-CM | POA: Diagnosis not present

## 2015-10-18 DIAGNOSIS — R0602 Shortness of breath: Secondary | ICD-10-CM

## 2015-10-18 DIAGNOSIS — R059 Cough, unspecified: Secondary | ICD-10-CM

## 2015-10-18 DIAGNOSIS — Z202 Contact with and (suspected) exposure to infections with a predominantly sexual mode of transmission: Secondary | ICD-10-CM

## 2015-10-18 DIAGNOSIS — R053 Chronic cough: Secondary | ICD-10-CM

## 2015-10-18 DIAGNOSIS — Z113 Encounter for screening for infections with a predominantly sexual mode of transmission: Secondary | ICD-10-CM

## 2015-10-18 DIAGNOSIS — E663 Overweight: Secondary | ICD-10-CM

## 2015-10-18 DIAGNOSIS — E559 Vitamin D deficiency, unspecified: Secondary | ICD-10-CM

## 2015-10-18 LAB — CBC WITH DIFFERENTIAL/PLATELET
BASOS ABS: 0 {cells}/uL (ref 0–200)
Basophils Relative: 0 %
EOS PCT: 1 %
Eosinophils Absolute: 85 cells/uL (ref 15–500)
HEMATOCRIT: 42.4 % (ref 35.0–45.0)
HEMOGLOBIN: 13.8 g/dL (ref 11.7–15.5)
LYMPHS ABS: 2210 {cells}/uL (ref 850–3900)
Lymphocytes Relative: 26 %
MCH: 28.6 pg (ref 27.0–33.0)
MCHC: 32.5 g/dL (ref 32.0–36.0)
MCV: 88 fL (ref 80.0–100.0)
MONO ABS: 595 {cells}/uL (ref 200–950)
MPV: 11.4 fL (ref 7.5–12.5)
Monocytes Relative: 7 %
NEUTROS ABS: 5610 {cells}/uL (ref 1500–7800)
Neutrophils Relative %: 66 %
Platelets: 273 10*3/uL (ref 140–400)
RBC: 4.82 MIL/uL (ref 3.80–5.10)
RDW: 14.1 % (ref 11.0–15.0)
WBC: 8.5 10*3/uL (ref 3.8–10.8)

## 2015-10-18 LAB — LIPID PANEL
CHOL/HDL RATIO: 4.4 ratio (ref <=5.0)
Cholesterol: 192 mg/dL (ref 125–200)
HDL: 44 mg/dL — AB (ref >=46)
LDL CALC: 129 mg/dL (ref <130)
TRIGLYCERIDES: 95 mg/dL (ref <150)
VLDL: 19 mg/dL (ref <30)

## 2015-10-18 LAB — COMPREHENSIVE METABOLIC PANEL
ALBUMIN: 4 g/dL (ref 3.6–5.1)
ALT: 13 U/L (ref 6–29)
AST: 15 U/L (ref 10–35)
Alkaline Phosphatase: 103 U/L (ref 33–130)
BILIRUBIN TOTAL: 0.8 mg/dL (ref 0.2–1.2)
BUN: 24 mg/dL (ref 7–25)
CALCIUM: 9.5 mg/dL (ref 8.6–10.4)
CO2: 27 mmol/L (ref 20–31)
CREATININE: 1.04 mg/dL — AB (ref 0.50–0.99)
Chloride: 101 mmol/L (ref 98–110)
Glucose, Bld: 99 mg/dL (ref 65–99)
Potassium: 5.1 mmol/L (ref 3.5–5.3)
SODIUM: 140 mmol/L (ref 135–146)
TOTAL PROTEIN: 7.2 g/dL (ref 6.1–8.1)

## 2015-10-18 MED ORDER — HYDROCOD POLST-CPM POLST ER 10-8 MG/5ML PO SUER: 5.0000 mL | Freq: Two times a day (BID) | ORAL | Status: DC | PRN

## 2015-10-18 NOTE — Progress Notes (Signed)
Pamela Reynolds, D.O. Primary care at [facility]  Subjective:    CC: New pt, here to establish care.   HPI: Pamela Reynolds is a pleasant 60 y.o. female who presents to [place] [facility] today To become established as well as follow-up for an abnormal chest x-ray.  PCP prior was Dr. Elsie Stain at [facility]. Patient wishes to transfer care here because she can never get in to see him and other reasons..  Chest Cold and annoying dry cough: Patient was seen on 10/07/2015 at the [facility] in occupational medicine practice in [place]. She was diagnosed with a "chest cold". At that time she was given Z-Pak and Tessalon Perles and was told that her chest x-ray was "abnormal" and she would need to repeat in 2 weeks.  Patient feels much better but still complains of a dry hacking cough at times and is worried about the repeat chest x-ray.  I do not have access to it today so I cannot review it personally. Also she does not have a radiologic report for my review either.   Of note patient reviews the history of that illness and apparently she was visiting her mom in [place] back in January and got a chest cold then. Symptoms have been waxing and waning since then but around [facility], it got much worse, which drove her into the urgent care.     She has had exposure to second-hand smoke all her life as her husband smokes.  Patient sees Dr. Donnella Bi cardiologist who manages patient's hypertension.  Patient's father died of a heart attack at age 5 and patient felt better seeing a cardiologist for this. An exercise stress echo was done in the recent past with was negative.  Apparently patient had hit her lower extremity and caused bruising in the recent past. She was seen by outside provider and was started on Xarelto as a prevention for DVT.  Patient states it was for a finite period that she was supposed to be on the medicine and will be  coming off soon.  Marital Discourse/STD: Patient is also worried because her husband had an affair on her and she is worried about STDs. She would like a full panel today. She goes to Jenetta Loges at [facility] gynecology and hence had labs done with her recent Pap smear, but apparently did not have the blood work done.  Patient states that her previous doctor, Dr. Elsie Stain used to found her about her safety and her relationship and patient said that that got annoying and that's part of the reason why she sought me out as well.   Patient has been getting marriage counseling by the [employer] that she and her husband along 2. They go approximately once a month and her husband will go more often as needed.  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 She will be seeing Dr. Amedeo Plenty of [place] for surgery at the end of this month on her right thumb.     Past Medical History  Diagnosis Date  . History of chicken pox   . Cardiac dysrhythmia, unspecified In distant past    S/P workup, was told this was benign  . Special screening for malignant neoplasms, colon   . Persistent cough     with URI, prev. trx'd with Hycodan  . NSVD (normal spontaneous  vaginal delivery) 1979 & [id number]    No complications  . Shortness of breath     Past Surgical History  Procedure Laterality Date  . Ovarian cysts  1973  . Appendectomy  1973  . Breast biopsy  1993  . Cholecystectomy  2004  . Btl  1983  . Foot neuroma surgery    . Oophorectomy    . Colposcopy    . Co2 laser application    . Tubal ligation      Family History  Problem Relation Age of Onset  . Hypertension Mother   . Heart disease Father     MI  . Colon cancer Neg Hx   . Breast cancer Neg Hx   . Heart disease Maternal Grandmother   . Heart disease Paternal Grandmother   . Hypertension Paternal Grandmother   . Heart disease Paternal Grandfather      History  Drug Use No  ,  History  Alcohol Use  . 0.0 oz/week  . 0 Standard drinks or equivalent per week    Comment: Very minimal (husband with h/o ETOH abuse), rarely  ,  History  Smoking status  . Never Smoker   Smokeless tobacco  . Not on file  ,  History  Sexual Activity  . Sexual Activity: Yes    Patient's Medications  New Prescriptions   No medications on file  Previous Medications   CITALOPRAM (CELEXA) 20 MG TABLET    TAKE 1/2 TO 1 TABLET BY MOUTH ONCE A DAY   CLONIDINE (CATAPRES) 0.1 MG TABLET    TAKE 1 TABLET BY MOUTH TWICE DAILY.   METOPROLOL (LOPRESSOR) 50 MG TABLET    TAKE 1 TABLET BY MOUTH 2 TIMES A DAY.   MULTIPLE VITAMIN (MULTIVITAMIN WITH MINERALS) TABS TABLET    Take 1 tablet by mouth daily. Reported on 09/08/2015   RIVAROXABAN (XARELTO STARTER PACK PO)    Take 1 tablet by mouth 2 (two) times daily.   SPIRONOLACTONE (ALDACTONE) 25 MG TABLET    Take 1 tablet (25 mg total) by mouth daily.   VALSARTAN-HYDROCHLOROTHIAZIDE (DIOVAN-HCT) 160-25 MG TABLET    Take 1 tablet by mouth daily.  Modified Medications   No medications on file  Discontinued Medications   AMOXICILLIN-CLAVULANATE (AUGMENTIN) 875-125 MG TABLET    Take 1 tablet by mouth 2 (two) times daily.   NAPROXEN SODIUM (ANAPROX) 220 MG TABLET    Take 220 mg by mouth daily.   NITROFURANTOIN, MACROCRYSTAL-MONOHYDRATE, (MACROBID) 100 MG CAPSULE    Take 1 capsule (100 mg total) by mouth 2 (two) times daily.    ALLERGIES: Amlodipine besylate and Lisinopril  Review of Systems: General:   Denies fever, chills, appetite changes, unexplained weight loss.  Optho/Auditory:   Denies visual changes, blurred vision/LOV, ringing in ears/ diff hearing Respiratory:   Denies SOB, DOE, + dry cough.  Cardiovascular:   Denies chest pain, palpitations, new onset peripheral edema  Gastrointestinal:   Denies nausea, vomiting, diarrhea.  Genitourinary:    Denies dysuria, increased frequency, flank pain.  Endocrine:      Denies hot or cold intolerance, polyuria, polydipsia. Musculoskeletal:  Denies joint swelling, gait problems, Does occasionally have age-related muscle/ joint pain.  Skin:  Denies rash, suspicious lesions or new/ changes in moles Neurological:    Denies dizziness, syncope, unexplained weakness, lightheadedness, numbness  Psychiatric/Behavioral:   Denies mood changes, suicidal or homicidal ideations, hallucinations    Objective:   Blood pressure 113/75, pulse 66, temperature 98.3 F (36.8 C), temperature source  Oral, height 5' 6.75" (1.695 m), weight 181 lb 4.8 oz (82.237 kg). Body mass index is 28.62 kg/(m^2).  General: Well Developed, well nourished, and in no acute distress.  Neuro: Alert and oriented x3, extra-ocular muscles intact, sensation grossly intact.  HEENT: Normocephalic, atraumatic, pupils equal round reactive to light, neck supple, no gross masses, no carotid bruits, no JVD apprec Skin: no gross suspicious lesions or rashes  Cardiac: Regular rate and rhythm, no murmurs rubs or gallops.  Respiratory: Essentially clear to auscultation bilaterally. Not using accessory muscles, speaking in full sentences.  Abdominal: Soft, not grossly distended Musculoskeletal: Ambulates w/o diff, FROM * 4 ext.  Vasc: less 2 sec cap RF, warm and pink  Psych:  No HI/SI, judgement and insight good.    Impression and Recommendations:   Full set labs (CBC, CMP, A1c, fasting lipid profile, TSH, vitamin D) and STD panel (HIV, RPR, hepatitis) as well now pleased since patient is fasting.  Essential hypertension Well-controlled continue current regimen. Treatment actively managed by Dr. Gwenlyn Found of cardiology.  I told patient I can absolutely prescribe her antihypertensive medications once on a stable regimen and she can follow up with Dr. Gwenlyn Found per his recommendations. Patient currently trying to wean clonidine as tolerated under the direction of the cardiologist.   Handouts on low salt as well as  DASH diet provided to patient after discussion with her about importance of dietary modification in the treatment of hypertension.  Recommend 30 minutes exercise of moderate intensity daily.  Shortness of breath History of shortness of breath, no symptoms currently. Does continue to have dry cough  Possible exposure to STD We will obtain blood work today. Patient has hepatitis b immunization since she isn't healthcare. Also had GYN and Pap since husband was unfaithful, so GC chlamydia and tric screening were obtained.  Overweight (BMI 25.0-29.9) Information on healthy eating discussed with patient and handouts provided.  Persistent cough Recently treated for "lung infection" with azithromycin and Tessalon Perles. Patient with a "abnormality seen on chest x-ray at the end of May.  Long discussion with patient regarding "abnormal" chest x-ray and how radiologic evidence on chest x-ray lags behind clinical clearance of illness.  thus, I recommend repeat chest x-ray in 6-8 weeks but patient would like it sooner in 3 weeks so she can have it prior to her thumb surgery at the end of the month.   I reminded patient that that is an elective surgery.   The patient was counselled, risk factors were discussed, anticipatory guidance given.  Pt was seen in the office today for 55+ minutes, with over 50% time spent in face the face counseling and coordination of care  Note: This document was prepared using Dragon voice recognition software and may include unintentional dictation errors.

## 2015-10-18 NOTE — Patient Instructions (Signed)
Obtain Bloodwork today, full std panel and health maintenance labs. - obtain cxr at or around 6/26, tussionex, Mucinex DM daily - f/up in 7-10 days     DASH Eating Plan DASH stands for "Dietary Approaches to Stop Hypertension." The DASH eating plan is a healthy eating plan that has been shown to reduce high blood pressure (hypertension). Additional health benefits may include reducing the risk of type 2 diabetes mellitus, heart disease, and stroke. The DASH eating plan may also help with weight loss. WHAT DO I NEED TO KNOW ABOUT THE DASH EATING PLAN? For the DASH eating plan, you will follow these general guidelines:  Choose foods with a percent daily value for sodium of less than 5% (as listed on the food label).  Use salt-free seasonings or herbs instead of table salt or sea salt.  Check with your health care provider or pharmacist before using salt substitutes.  Eat lower-sodium products, often labeled as "lower sodium" or "no salt added."  Eat fresh foods.  Eat more vegetables, fruits, and low-fat dairy products.  Choose whole grains. Look for the word "whole" as the first word in the ingredient list.  Choose fish and skinless chicken or [place] more often than red meat. Limit fish, poultry, and meat to 6 oz (170 g) each day.  Limit sweets, desserts, sugars, and sugary drinks.  Choose heart-healthy fats.  Limit cheese to 1 oz (28 g) per day.  Eat more home-cooked food and less restaurant, buffet, and fast food.  Limit fried foods.  Cook foods using methods other than frying.  Limit canned vegetables. If you do use them, rinse them well to decrease the sodium.  When eating at a restaurant, ask that your food be prepared with less salt, or no salt if possible. WHAT FOODS CAN I EAT? Seek help from a dietitian for individual calorie needs. Grains Whole grain or whole wheat bread. Brown rice. Whole grain or whole wheat pasta. Quinoa, bulgur, and whole grain cereals.  Low-sodium cereals. Corn or whole wheat flour tortillas. Whole grain cornbread. Whole grain crackers. Low-sodium crackers. Vegetables Fresh or frozen vegetables (raw, steamed, roasted, or grilled). Low-sodium or reduced-sodium tomato and vegetable juices. Low-sodium or reduced-sodium tomato sauce and paste. Low-sodium or reduced-sodium canned vegetables.  Fruits All fresh, canned (in natural juice), or frozen fruits. Meat and Other Protein Products Ground beef (85% or leaner), grass-fed beef, or beef trimmed of fat. Skinless chicken or [place]. Ground chicken or [place]. Pork trimmed of fat. All fish and seafood. Eggs. Dried beans, peas, or lentils. Unsalted nuts and seeds. Unsalted canned beans. Dairy Low-fat dairy products, such as skim or 1% milk, 2% or reduced-fat cheeses, low-fat ricotta or cottage cheese, or plain low-fat yogurt. Low-sodium or reduced-sodium cheeses. Fats and Oils Tub margarines without trans fats. Light or reduced-fat mayonnaise and salad dressings (reduced sodium). Avocado. Safflower, olive, or canola oils. Natural peanut or almond butter. Other Unsalted popcorn and pretzels. The items listed above may not be a complete list of recommended foods or beverages. Contact your dietitian for more options. WHAT FOODS ARE NOT RECOMMENDED? Grains White bread. White pasta. White rice. Refined cornbread. Bagels and croissants. Crackers that contain trans fat. Vegetables Creamed or fried vegetables. Vegetables in a cheese sauce. Regular canned vegetables. Regular canned tomato sauce and paste. Regular tomato and vegetable juices. Fruits Dried fruits. Canned fruit in light or heavy syrup. Fruit juice. Meat and Other Protein Products Fatty cuts of meat. Ribs, chicken wings, bacon, sausage, bologna, salami, chitterlings, fatback,  hot dogs, bratwurst, and packaged luncheon meats. Salted nuts and seeds. Canned beans with salt. Dairy Whole or 2% milk, cream, half-and-half, and cream  cheese. Whole-fat or sweetened yogurt. Full-fat cheeses or blue cheese. Nondairy creamers and whipped toppings. Processed cheese, cheese spreads, or cheese curds. Condiments Onion and garlic salt, seasoned salt, table salt, and sea salt. Canned and packaged gravies. Worcestershire sauce. Tartar sauce. Barbecue sauce. Teriyaki sauce. Soy sauce, including reduced sodium. Steak sauce. Fish sauce. Oyster sauce. Cocktail sauce. Horseradish. Ketchup and mustard. Meat flavorings and tenderizers. Bouillon cubes. Hot sauce. Tabasco sauce. Marinades. Taco seasonings. Relishes. Fats and Oils Butter, stick margarine, lard, shortening, ghee, and bacon fat. Coconut, palm kernel, or palm oils. Regular salad dressings. Other Pickles and olives. Salted popcorn and pretzels. The items listed above may not be a complete list of foods and beverages to avoid. Contact your dietitian for more information. WHERE CAN I FIND MORE INFORMATION? National Heart, Lung, and Blood Institute: travelstabloid.com   This information is not intended to replace advice given to you by your health care provider. Make sure you discuss any questions you have with your health care provider.   Document Released: 04/16/2011 Document Revised: 05/18/2014 Document Reviewed: 03/01/2013 Elsevier Interactive Patient Education 2016 [place] for a Low Sodium Diet   Low Sodium Diet A main source of sodium is table salt. The average American eats five or more teaspoons of salt each day. This is about 20 times as much as the body needs. In fact, your body needs only 1/4 teaspoon of salt every day. Sodium is found naturally in foods, but a lot of it is added during processing and preparation. Many foods that do not taste salty may still be high in sodium. Large amounts of sodium can be hidden in canned, processed and convenience foods. And sodium can be found in many foods that are served at [employer].  Sodium controls fluid balance in our bodies and maintains blood volume and blood pressure. Eating too much sodium may raise blood pressure and cause fluid retention, which could lead to swelling of the legs and feet or other health issues.  When limiting sodium in your diet, a common target is to eat less than 2,000 milligrams of sodium per day.   General Guidelines for Cutting Down on Salt Eliminate salty foods from your diet and reduce the amount of salt used in cooking. Sea salt is no better than regular salt.  Choose low sodium foods. Many salt-free or reduced salt products are available. When reading food labels, low sodium is defined as 140 mg of sodium per serving.  Salt substitutes are sometimes made from potassium, so read the label. If you are on a low potassium diet, then check with your doctor before using those salt substitutes.  Be creative and season your foods with spices, herbs, lemon, garlic, ginger, vinegar and pepper. Remove the salt shaker from the table.  Read ingredient labels to identify foods high in sodium. Items with 400 mg or more of sodium are high in sodium. High sodium food additives include salt, brine, or other items that say sodium, such as monosodium glutamate.  Eat more home-cooked meals. Foods cooked from scratch are naturally lower in sodium than most instant and boxed mixes.  Don't use softened water for cooking and drinking since it contains added salt.  Avoid medications which contain sodium such as [employer].  For more information; food composition books  are available which tell how much sodium is in food. Online sources such as www.calorieking.com also list amounts.     Meats, Poultry, Fish, Legumes, Eggs and Nuts  High-Sodium Foods: Smoked, cured, salted or canned meat, fish or poultry including bacon, cold cuts, ham, frankfurters, sausage, sardines, caviar and anchovies Frozen breaded meats and  dinners, such as burritos and pizza Canned entrees, such as ravioli, spam and chili Salted nuts Beans canned with salt added  Low-Sodium Alternatives: Any fresh or frozen beef, lamb, pork, poultry and fish Eggs and egg substitutes Low-sodium peanut butter Dry peas and beans (not canned) Low-sodium canned fish Drained, water or oil packed canned fish or poultry   Dairy Products  High-Sodium Foods: Buttermilk Regular and processed cheese, cheese spreads and sauces Cottage cheese  Low-Sodium Alternatives: Milk, yogurt, ice cream and ice milk Low-sodium cheeses, cream cheese, ricotta cheese and mozzarella   Breads, Grains and Cereals  High-Sodium Foods: Bread and rolls with salted tops Quick breads, self-rising flour, biscuit, pancake and waffle mixes Pizza, croutons and salted crackers Prepackaged, processed mixes for potatoes, rice, pasta and stuffing  Low-Sodium Alternatives: Breads, bagels and rolls without salted tops Muffins and most ready-to-eat cereals All rice and pasta, but do not to add salt when cooking Low-sodium corn and flour tortillas and noodles Low-sodium crackers and breadsticks Unsalted popcorn, chips and pretzels      Vegetables and Fruits  High-Sodium Foods: Regular canned vegetables and vegetable juices Olives, pickles, sauerkraut and other pickled vegetables Vegetables made with ham, bacon or salted pork Packaged mixes, such as scalloped or au gratin potatoes, frozen hash browns and Tater Tots Commercially prepared pasta and tomato sauces and salsa  Low-Sodium Alternatives: Fresh and frozen vegetables without sauces Low-sodium canned vegetables, sauces and juices Fresh potatoes, frozen [place] fries and instant mashed potatoes Low-salt tomato or V-8 juice. Most fresh, frozen and canned fruit Dried fruits   Soups  High-Sodium Foods: Regular canned and dehydrated soup, broth and bouillon Cup of noodles and seasoned ramen  mixes  Low-Sodium Alternatives: Low-sodium canned and dehydrated soups, broth and bouillon Homemade soups without added salt   Fats, Desserts and Sweets  High-Sodium Foods: Soy sauce, seasoning salt, other sauces and marinades Bottled salad dressings, regular salad dressing with bacon bits Salted butter or margarine Instant pudding and cake Large portions of ketchup, mustard  Low-Sodium Alternatives: Vinegar, unsalted butter or margarine Vegetable oils and low sodium sauces and salad dressings Mayonnaise All desserts made without salt  Cough, Adult Coughing is a reflex that clears your throat and your airways. Coughing helps to heal and protect your lungs. It is normal to cough occasionally, but a cough that happens with other symptoms or lasts a long time may be a sign of a condition that needs treatment. A cough may last only 2-3 weeks (acute), or it may last longer than 8 weeks (chronic). CAUSES Coughing is commonly caused by:  Breathing in substances that irritate your lungs.  A viral or bacterial respiratory infection.  Allergies.  Asthma.  Postnasal drip.  Smoking.  Acid backing up from the stomach into the esophagus (gastroesophageal reflux).  Certain medicines.  Chronic lung problems, including COPD (or rarely, lung cancer).  Other medical conditions such as heart failure. HOME CARE INSTRUCTIONS  Pay attention to any changes in your symptoms. Take these actions to help with your discomfort:  Take medicines only as told by your health care provider.  If you were prescribed an antibiotic medicine, take it as told by  your health care provider. Do not stop taking the antibiotic even if you start to feel better.  Talk with your health care provider before you take a cough suppressant medicine.  Drink enough fluid to keep your urine clear or pale yellow.  If the air is dry, use a cold steam vaporizer or humidifier in your bedroom or your home to help loosen  secretions.  Avoid anything that causes you to cough at work or at home.  If your cough is worse at night, try sleeping in a semi-upright position.  Avoid cigarette smoke. If you smoke, quit smoking. If you need help quitting, ask your health care provider.  Avoid caffeine.  Avoid alcohol.  Rest as needed. SEEK MEDICAL CARE IF:   You have new symptoms.  You cough up pus.  Your cough does not get better after 2-3 weeks, or your cough gets worse.  You cannot control your cough with suppressant medicines and you are losing sleep.  You develop pain that is getting worse or pain that is not controlled with pain medicines.  You have a fever.  You have unexplained weight loss.  You have night sweats. SEEK IMMEDIATE MEDICAL CARE IF:  You cough up blood.  You have difficulty breathing.  Your heartbeat is very fast.   This information is not intended to replace advice given to you by your health care provider. Make sure you discuss any questions you have with your health care provider.   Document Released: 10/24/2010 Document Revised: 01/16/2015 Document Reviewed: 07/04/2014 Elsevier Interactive Patient Education [employer].

## 2015-10-19 LAB — HIV ANTIBODY (ROUTINE TESTING W REFLEX): HIV 1&2 Ab, 4th Generation: NONREACTIVE

## 2015-10-19 LAB — HEMOGLOBIN A1C
HEMOGLOBIN A1C: 5.8 % — AB (ref <5.7)
MEAN PLASMA GLUCOSE: 120 mg/dL

## 2015-10-19 LAB — TSH: TSH: 1.25 mIU/L

## 2015-10-19 LAB — HEPATITIS C ANTIBODY: HCV Ab: NEGATIVE

## 2015-10-19 LAB — RPR

## 2015-10-19 LAB — VITAMIN D 25 HYDROXY (VIT D DEFICIENCY, FRACTURES): VIT D 25 HYDROXY: 36 ng/mL (ref 30–100)

## 2015-10-21 DIAGNOSIS — R05 Cough: Secondary | ICD-10-CM | POA: Insufficient documentation

## 2015-10-21 DIAGNOSIS — E663 Overweight: Secondary | ICD-10-CM | POA: Insufficient documentation

## 2015-10-21 DIAGNOSIS — Z202 Contact with and (suspected) exposure to infections with a predominantly sexual mode of transmission: Secondary | ICD-10-CM | POA: Insufficient documentation

## 2015-10-21 DIAGNOSIS — R053 Chronic cough: Secondary | ICD-10-CM | POA: Insufficient documentation

## 2015-10-21 NOTE — Assessment & Plan Note (Signed)
Information on healthy eating discussed with patient and handouts provided.

## 2015-10-21 NOTE — Assessment & Plan Note (Addendum)
Well-controlled continue current regimen. Treatment actively managed by Dr. Gwenlyn Found of cardiology.  I told patient I can absolutely prescribe her antihypertensive medications once on a stable regimen and she can follow up with Dr. Gwenlyn Found per his recommendations. Patient currently trying to wean clonidine as tolerated under the direction of the cardiologist.   Handouts on low salt as well as DASH diet provided to patient after discussion with her about importance of dietary modification in the treatment of hypertension.  Recommend 30 minutes exercise of moderate intensity daily.

## 2015-10-21 NOTE — Assessment & Plan Note (Signed)
Recently treated for "lung infection" with azithromycin and Tessalon Perles. Patient with a "abnormality seen on chest x-ray at the end of May.  Long discussion with patient regarding "abnormal" chest x-ray and how radiologic evidence on chest x-ray lags behind clinical clearance of illness.  thus, I recommend repeat chest x-ray in 6-8 weeks but patient would like it sooner in 3 weeks so she can have it prior to her thumb surgery at the end of the month.   I reminded patient that that is an elective surgery.

## 2015-10-21 NOTE — Assessment & Plan Note (Signed)
We will obtain blood work today. Patient has hepatitis b immunization since she isn't healthcare. Also had GYN and Pap since husband was unfaithful, so GC chlamydia and tric screening were obtained.

## 2015-10-21 NOTE — Assessment & Plan Note (Signed)
History of shortness of breath, no symptoms currently. Does continue to have dry cough

## 2015-10-24 ENCOUNTER — Telehealth: Payer: Self-pay | Admitting: Cardiovascular Disease

## 2015-10-24 NOTE — Telephone Encounter (Signed)
Outgoing message goes to patient's VM.  She has a DPR stating OK to leave detailed message.  I left msg w recommendation to seek help at ER or urgent care if BPs not improved, esp w/ symptoms given. Conditionally, she could hold evening dose of catapres. Advised to call back in AM if needing further advice. Will seek additional advisement from pharmD - pt is followed for BP monitoring and seen recently in office by Erasmo Downer.

## 2015-10-24 NOTE — Telephone Encounter (Signed)
Pt c/o BP issue: STAT if pt c/o blurred vision, one-sided weakness or slurred speech  1. What are your last 5 BP readings? Pt states she took bp three times first two times were high 80/40  4:30ps 6/15 90/50  2. Are you having any other symptoms (ex. Dizziness, headache, blurred vision, passed out)? Pt states Dizziness And diarrhea   3. What is your BP issue? Pt wants to know if there is something she can take or stop taking to get her bp back up to normal   Comments pt states she is out of town. Please call back to discuss

## 2015-10-25 ENCOUNTER — Ambulatory Visit: Payer: BLUE CROSS/BLUE SHIELD | Admitting: Family Medicine

## 2015-10-25 NOTE — Telephone Encounter (Signed)
Spoke to patient she is feeling much better. Diarrhea has stopped and bp this morning was 120/80. She did not take her clonidine yesterday and will remain off as discussed in her last visit with Erasmo Downer. She will call if she has any other issues.

## 2015-10-25 NOTE — Telephone Encounter (Signed)
LMTCB about blood pressure medications - can hold spironolactone until diarrhea clears. Has she stopped clonidine?

## 2015-11-01 ENCOUNTER — Encounter: Payer: Self-pay | Admitting: Family Medicine

## 2015-11-01 ENCOUNTER — Ambulatory Visit (INDEPENDENT_AMBULATORY_CARE_PROVIDER_SITE_OTHER): Payer: BLUE CROSS/BLUE SHIELD | Admitting: Family Medicine

## 2015-11-01 VITALS — BP 136/83 | HR 70 | Ht 66.75 in | Wt 177.1 lb

## 2015-11-01 DIAGNOSIS — I10 Essential (primary) hypertension: Secondary | ICD-10-CM | POA: Diagnosis not present

## 2015-11-01 DIAGNOSIS — Z1239 Encounter for other screening for malignant neoplasm of breast: Secondary | ICD-10-CM | POA: Diagnosis not present

## 2015-11-01 DIAGNOSIS — E663 Overweight: Secondary | ICD-10-CM

## 2015-11-01 DIAGNOSIS — R7302 Impaired glucose tolerance (oral): Secondary | ICD-10-CM

## 2015-11-01 NOTE — Patient Instructions (Addendum)
Drink more water- 1/2 of your weight in pounds =  ounces per day  Avoid white flour products- breads Pasta's rice etc. and avoid sweets, ice cream cookies cakes pies etc.  Try to move more. Goal should be 20 minutes every day of moderate intensity exercise or equal to 150 minutes per week of moderate intensity cardiovascular exercise.        Patient education: Preventing type 2 diabetes (The Basics)  Written by the doctors and editors at UpToDate  Can type 2 diabetes be prevented? - Yes! Studies show that people who are at risk can prevent type 2 diabetes by: ?Losing weight (if they are overweight) ?Being active ?Improving the way they eat ?Taking certain medicines (most often 1 called metformin) There's some evidence that quitting smoking also lowers the risk of developing type 2 diabetes, but scientists need to do more studies to be sure. Even so, there are plenty of good reasons to quit. Quitting smoking lowers your risk of stroke, heart disease, and lots of other problems. What increases my risk for type 2 diabetes? - There are a few factors that can increase your risk of diabetes, including: ?Being overweight or obese, especially if you carry your extra weight in your belly (as opposed to in your hips, thighs, and butt) ?Not doing enough physical activity ?Smoking ?Having a family history of diabetes ?Having diabetes during pregnancy, called "gestational diabetes" (if you are a woman) Plus, Asian, Latino, or black people are more likely to get diabetes than white people. Are there tests that can find people who are at risk? - Yes. There are 3 different tests that can help doctors tell whether a person might develop type 2 diabetes. All 3 tests measure blood sugar in different ways. "Blood glucose" is another name for blood sugar. Even though these tests can help predict diabetes, they are not appropriate for everyone. Your doctor or nurse will decide if 1 of these tests is right for  you. Often, people who get tested are overweight and have another risk factor for diabetes. Examples of risk factors include having a history of diabetes during pregnancy or a family history of diabetes. If a blood test shows that a person's blood sugar is higher than normal but not high enough to be called diabetes, doctors call it "pre-diabetes." People with pre-diabetes are at high risk of developing diabetes. ?Fasting glucose test - This test measures your blood sugar when you have not had anything to eat or drink (except water) for 8 hours. People with pre-diabetes have a fasting glucose between 100 and 125 (table 1). ?Glucose tolerance test - For this test you do not eat or drink anything for 8 to 12 hours. But then, as part of the test, you have a sugary drink. Two hours later, a doctor or nurse takes a blood sample to see how high your blood sugar got. People with prediabetes have glucose tolerance results between 140 and 199 (table 1). ?Hemoglobin A1C test (also called HbA1C or A1C) - For this test it does not matter whether you eat beforehand. It is a blood test that shows what your average blood sugar level has been for the past 2 to 3 months. People with pre-diabetes have A1C levels between 5.7 and 6.4. What should I do if I have pre-diabetes? - If you have pre-diabetes, make lifestyle changes to reduce the chance that you will get full-blown diabetes. Here's what you should do: ?Lose weight - Losing 5 to 10 percent of  your body weight can lower your risk a lot. If you weigh 200 pounds, that means you should lose 10 to 20 pounds. If you weigh 150 pounds, that means you should lose 7 to 15 pounds. ?Eat right - Choose a diet rich in fruits, vegetables, and low-fat dairy products, but low in meats, sweets, and refined grains. Stay away from sweet drinks, like soda and juice. ?Be active for 30 minutes a day - You don't have to go to the gym or break a sweat to get a benefit. Walking, gardening, and  dancing are all activities that can help. ?Quit smoking - If you smoke, ask your doctor or nurse for advice on how to quit. People are much more likely to succeed if they have help and get medicines to help them quit. Take your medicines - If your doctor or nurse prescribed any medicines, take them every day, as directed. That goes for medicines to prevent diabetes, and for ones to lower blood pressure or cholesterol. People with pre-diabetes have a higher-than-average risk of heart attacks, strokes, and other problems, so those medicines are important. More on this topic Patient education: Hemoglobin A1C tests (The Basics) Patient education: Type 2 diabetes (The Basics) Patient education: The ABCs of diabetes (The Basics) Patient education: Treatment for type 2 diabetes (The Basics) Patient education: Weight loss treatments (The Basics) Patient education: Diet and health (The Basics) Patient education: Exercise (The Basics) Patient education: Quitting smoking (The Basics) Patient education: Health risks of obesity (The Basics) Patient education: Type 2 diabetes mellitus and diet (Beyond the Basics) Patient education: Diabetes mellitus type 2: Overview (Beyond the Basics) Patient education: Diabetes mellitus type 2: Treatment (Beyond the Basics) Patient education: Diabetes mellitus type 2: Alcohol, exercise, and medical care (Beyond the Basics) All topics are updated as new evidence becomes available and our peer review process is complete.  This topic retrieved from UpToDate on: Nov 01, 2015.  The content on the UpToDate website is not intended nor recommended as a substitute for medical advice, diagnosis, or treatment. Always seek the advice of your own physician or other qualified health care professional regarding any medical questions or conditions. The use of UpToDate content is governed by the UpToDate Terms of Use. 2017 [employer] rights reserved.  Topic [phone] Version 7.0   GRAPHICS Diabetes screening tests   Glucose level  Fasting glucose  Normal 70 to 99  Pre-diabetes 100 to 125  Diabetes 126 or higher on at least 2 tests  Glucose tolerance  Normal Below 140  Pre-diabetes 140 to 199  Diabetes 200 or higher on at least 2 tests   A1C percent  A1C  Normal Below 5.7  Pre-diabetes 5.7 to 6.4  Diabetes 6.5 or higher on at least 2 tests  Pre-diabetes is a term doctor or nurses use as a warning. People with pre-diabetes do not yet have diabetes, but they are at increased risk of getting it.  Graphic [phone] Version 2.0         Impaired glucose tolerance  From Wikipedia, the free encyclopedia  Jump to: navigation, search  Impaired glucose tolerance  Classification and external resources  ICD-10 R73.0  ICD-9-CM 790.21  MeSH [id number]  [edit on Wikidata]  Impaired glucose tolerance (IGT) is a pre-diabetic state of hyperglycemia that is associated with insulin resistance and increased risk of cardiovascular pathology. IGT may precede type 2 diabetes mellitus by many years. IGT is also a risk factor for mortality.[1] Contents [hide]  1 Criteria  2 Treatment  3 See also  4 References  5 Further reading Criteria[edit] According to the criteria of the [place] and the American Diabetes Association, impaired glucose tolerance is defined as:[2][3][4] two-hour glucose levels of 140 to 199 mg per dL (7.8 to 11.0 mmol/l) on the 75-g oral glucose tolerance test. A patient is said to be under the condition of IGT when he/she has an intermediately raised glucose level after 2 hours, but less than the level that would qualify for type 2 diabetes mellitus. The fasting glucose may be either normal or mildly elevated. From 10 to 15 percent of adults in the [place] have impaired glucose tolerance or impaired fasting glucose.[5] Treatment[edit] Main article: Prevention of diabetes mellitus type 2  The risk of progression to diabetes and  development of cardiovascular disease is greater than for impaired fasting glucose.[6] Although some drugs can delay the onset of diabetes, lifestyle modifications play a greater role in the prevention of diabetes.[5][7] Patients identified as having an IGT may be able to prevent diabetes through a combination of increased exercise and reduction of body weight.[5] "Drug therapy can be considered when aggressive lifestyle interventions are unsuccessful."[5]

## 2015-11-01 NOTE — Progress Notes (Signed)
Subjective:    CC: f/up labs   HPI: Pamela Reynolds is a 60 y.o. female who presents to [place] at [facility] today  1) To review recent labs done on 10/18/15 with pt in person/ discuss implications of them   2) For a sore spot on her scalp - sore for at least one year.  Doesn't scab etc. just sore, no injury. Not particularly bad right now on pt.      Past Medical History  Diagnosis Date  . History of chicken pox   . Cardiac dysrhythmia, unspecified In distant past    S/P workup, was told this was benign  . Special screening for malignant neoplasms, colon   . Persistent cough     with URI, prev. trx'd with Hycodan  . NSVD (normal spontaneous vaginal delivery) 1979 & 3875    No complications  . Shortness of breath     Past Surgical History  Procedure Laterality Date  . Ovarian cysts  1973  . Appendectomy  1973  . Breast biopsy  1993  . Cholecystectomy  2004  . Btl  1983  . Foot neuroma surgery    . Oophorectomy    . Colposcopy    . Co2 laser application    . Tubal ligation      Family History  Problem Relation Age of Onset  . Hypertension Mother   . Heart disease Father     MI  . Colon cancer Neg Hx   . Breast cancer Neg Hx   . Heart disease Maternal Grandmother   . Heart disease Paternal Grandmother   . Hypertension Paternal Grandmother   . Heart disease Paternal Grandfather     History  Drug Use No  ,  History  Alcohol Use  . 0.0 oz/week  . 0 Standard drinks or equivalent per week    Comment: Very minimal (husband with h/o ETOH abuse), rarely  ,  History  Smoking status  . Never Smoker   Smokeless tobacco  . Not on file  ,  History  Sexual Activity  . Sexual Activity: Yes    Current Outpatient Prescriptions on File Prior to Visit  Medication Sig Dispense Refill  . chlorpheniramine-HYDROcodone (TUSSIONEX) 10-8 MG/5ML SUER Take 5 mLs by mouth every 12 (twelve) hours as needed for cough (cough, will cause drowsiness.). 200  mL 0  . citalopram (CELEXA) 20 MG tablet TAKE 1/2 TO 1 TABLET BY MOUTH ONCE A DAY 90 tablet 1  . metoprolol (LOPRESSOR) 50 MG tablet TAKE 1 TABLET BY MOUTH 2 TIMES A DAY. 180 tablet 3  . Multiple Vitamin (MULTIVITAMIN WITH MINERALS) TABS tablet Take 1 tablet by mouth daily. Reported on 09/08/2015    . spironolactone (ALDACTONE) 25 MG tablet Take 1 tablet (25 mg total) by mouth daily. 90 tablet 3  . valsartan-hydrochlorothiazide (DIOVAN-HCT) 160-25 MG tablet Take 1 tablet by mouth daily. 30 tablet 6   No current facility-administered medications on file prior to visit.    Allergies  Allergen Reactions  . Amlodipine Besylate     REACTION: edema and rash on bilateral ankles  . Lisinopril     REACTION: presumed ACE cough     Review of Systems:  ( Completed via her adult medical history intake form today ) General:  Denies fever, chills, appetite changes, unexplained weight loss.  Respiratory: Denies SOB, DOE, cough, wheezing.  Cardiovascular: Denies chest pain, palpitations.  Gastrointestinal: Denies nausea, vomiting, diarrhea, abdominal pain.  Genitourinary:  Denies dysuria, increased frequency, flank pain. Endocrine: Denies hot or cold intolerance, polyuria, polydipsia. Musculoskeletal: Denies myalgias, back pain, joint swelling, arthralgias, gait problems.  Skin: Denies pallor, rash, suspicious lesions.  Neurological: Denies dizziness, seizures, syncope, unexplained weakness, lightheadedness, numbness and headaches.  Psychiatric/Behavioral: Denies mood changes, suicidal or homicidal ideations, hallucinations, sleep disturbances.     Objective:     Filed Vitals:   11/01/15 0908  Height: 5' 6.75" (1.695 m)  Weight: 177 lb 1.6 oz (80.332 kg)   Blood pressure 136/83, pulse 70, height 5' 6.75" (1.695 m), weight 177 lb 1.6 oz (80.332 kg). Body mass index is 27.96 kg/(m^2). General: Well Developed, well nourished, and in no acute distress.  HEENT: Normocephalic, atraumatic, pupils  equal round reactive to light, neck supple Skin: Warm and dry, cap RF less 2 sec, no scalp rash/ abn apprec Cardiac: Regular rate and rhythm, S1, S2 WNL's, no murmurs rubs or gallops Respiratory: ECTA B/L, Not using accessory muscles, speaking in full sentences. NeuroM-Sk: Ambulates w/o assistance, moves ext * 4 w/o difficulty, sensation grossly intact.  Psych: A and O *3, judgement and insight good.     Recent Results (from the past 2160 hour(s))  Aldosterone + renin activity w/ ratio     Status: Abnormal   Collection Time: 08/23/15  8:36 AM  Result Value Ref Range   PRA LC/MS/MS 0.06 (L) 0.25 - 5.82 ng/mL/h   ALDO / PRA Ratio 100.0 (H) 0.9 - 28.9 Ratio   Aldosterone 6 ng/dL    Comment:      Adult Reference Ranges for Aldosterone,    LC/MS/MS:       Upright 8:00-10:00 am    < or = 28 ng/dL     Upright 4:00-6:00 pm     < or = 21 ng/dL     Supine  8:00-10:00 am    3-16 ng/dL   This test was developed and its analytical performance characteristics have been determined by [facility]. It has not been cleared or approved by FDA. This assay has been validated pursuant to the CLIA regulations and is used for clinical purposes.     Basic metabolic panel     Status: None   Collection Time: 09/06/15 11:58 AM  Result Value Ref Range   Sodium 142 135 - 146 mmol/L   Potassium 4.1 3.5 - 5.3 mmol/L   Chloride 101 98 - 110 mmol/L   CO2 28 20 - 31 mmol/L   Glucose, Bld 85 65 - 99 mg/dL   BUN 22 7 - 25 mg/dL   Creat 0.79 0.50 - 0.99 mg/dL   Calcium 9.4 8.6 - 10.4 mg/dL  POCT urinalysis dipstick     Status: Abnormal   Collection Time: 09/08/15  4:27 PM  Result Value Ref Range   Color, UA yellow yellow   Clarity, UA cloudy (A) clear   Glucose, UA negative negative   Bilirubin, UA negative negative   Ketones, POC UA trace (5) (A) negative   Spec Grav, UA 1.015    Blood, UA trace-intact (A) negative   pH, UA 7.0    Protein Ur, POC trace (A)  negative   Urobilinogen, UA 1.0    Nitrite, UA Positive (A) Negative   Leukocytes, UA moderate (2+) (A) Negative  POCT Microscopic Urinalysis (UMFC)     Status: Abnormal   Collection Time: 09/08/15  4:28 PM  Result Value Ref Range   WBC,UR,HPF,POC Too numerous to count  (A) None WBC/hpf  RBC,UR,HPF,POC Few (A) None RBC/hpf   Bacteria Moderate (A) None, Too numerous to count   Mucus Present (A) Absent   Epithelial Cells, UR Per Microscopy Few (A) None, Too numerous to count cells/hpf  Urine culture     Status: None   Collection Time: 09/08/15  5:10 PM  Result Value Ref Range   Culture ESCHERICHIA COLI    Colony Count >=100,000 COLONIES/ML    Organism ID, Bacteria ESCHERICHIA COLI       Susceptibility   Escherichia coli -  (no method available)    AMPICILLIN 4 Sensitive     AMOX/CLAVULANIC <=2 Sensitive     AMPICILLIN/SULBACTAM 4 Sensitive     PIP/TAZO <=4 Sensitive     IMIPENEM <=0.25 Sensitive     CEFAZOLIN <=4 Not Reportable     CEFTRIAXONE <=1 Sensitive     CEFTAZIDIME <=1 Sensitive     CEFEPIME <=1 Sensitive     GENTAMICIN <=1 Sensitive     TOBRAMYCIN <=1 Sensitive     CIPROFLOXACIN <=0.25 Sensitive     LEVOFLOXACIN <=0.12 Sensitive     NITROFURANTOIN <=16 Sensitive     TRIMETH/SULFA* <=20 Sensitive      * NR=NOT REPORTABLE,SEE COMMENTORAL therapy:A cefazolin MIC of <32 predicts susceptibility to the oral agents cefaclor,cefdinir,cefpodoxime,cefprozil,cefuroxime,cephalexin,and loracarbef when used for therapy of uncomplicated UTIs due to E.coli,K.pneumomiae,and P.mirabilis. PARENTERAL therapy: A cefazolinMIC of >8 indicates resistance to parenteralcefazolin. An alternate test method must beperformed to confirm susceptibility to parenteralcefazolin.  POCT urinalysis dipstick     Status: Abnormal   Collection Time: 10/01/15  6:09 PM  Result Value Ref Range   Color, UA yellow yellow   Clarity, UA clear clear   Glucose, UA negative negative   Bilirubin, UA negative negative     Ketones, POC UA negative negative   Spec Grav, UA 1.020    Blood, UA trace-intact (A) negative   pH, UA 6.0    Protein Ur, POC negative negative   Urobilinogen, UA 0.2    Nitrite, UA Negative Negative   Leukocytes, UA moderate (2+) (A) Negative  POCT Microscopic Urinalysis ([facility])     Status: Abnormal   Collection Time: 10/01/15  6:09 PM  Result Value Ref Range   WBC,UR,HPF,POC Few (A) None WBC/hpf   RBC,UR,HPF,POC None None RBC/hpf   Bacteria Moderate (A) None, Too numerous to count   Mucus Absent Absent   Epithelial Cells, UR Per Microscopy Few (A) None, Too numerous to count cells/hpf  CBC w/Diff     Status: None   Collection Time: 10/18/15 12:40 PM  Result Value Ref Range   WBC 8.5 3.8 - 10.8 K/uL   RBC 4.82 3.80 - 5.10 MIL/uL   Hemoglobin 13.8 11.7 - 15.5 g/dL   HCT 42.4 35.0 - 45.0 %   MCV 88.0 80.0 - 100.0 fL   MCH 28.6 27.0 - 33.0 pg   MCHC 32.5 32.0 - 36.0 g/dL   RDW 14.1 11.0 - 15.0 %   Platelets 273 140 - 400 K/uL   MPV 11.4 7.5 - 12.5 fL   Neutro Abs 5610 1500 - 7800 cells/uL   Lymphs Abs 2210 850 - 3900 cells/uL   Monocytes Absolute 595 200 - 950 cells/uL   Eosinophils Absolute 85 15 - 500 cells/uL   Basophils Absolute 0 0 - 200 cells/uL   Neutrophils Relative % 66 %   Lymphocytes Relative 26 %   Monocytes Relative 7 %   Eosinophils Relative 1 %   Basophils Relative 0 %  Smear Review Criteria for review not met     Comment: ** Please note change in unit of measure and reference range(s). **  Comp Met (CMET)     Status: Abnormal   Collection Time: 10/18/15 12:40 PM  Result Value Ref Range   Sodium 140 135 - 146 mmol/L   Potassium 5.1 3.5 - 5.3 mmol/L   Chloride 101 98 - 110 mmol/L   CO2 27 20 - 31 mmol/L   Glucose, Bld 99 65 - 99 mg/dL   BUN 24 7 - 25 mg/dL   Creat 1.04 (H) 0.50 - 0.99 mg/dL   Total Bilirubin 0.8 0.2 - 1.2 mg/dL   Alkaline Phosphatase 103 33 - 130 U/L   AST 15 10 - 35 U/L   ALT 13 6 - 29 U/L   Total Protein 7.2 6.1 - 8.1 g/dL    Albumin 4.0 3.6 - 5.1 g/dL   Calcium 9.5 8.6 - 10.4 mg/dL  HgB A1c     Status: Abnormal   Collection Time: 10/18/15 12:40 PM  Result Value Ref Range   Hgb A1c MFr Bld 5.8 (H) <5.7 %    Comment:   For someone without known diabetes, a hemoglobin A1c value between 5.7% and 6.4% is consistent with prediabetes and should be confirmed with a follow-up test.   For someone with known diabetes, a value <7% indicates that their diabetes is well controlled. A1c targets should be individualized based on duration of diabetes, age, co-morbid conditions and other considerations.   This assay result is consistent with an increased risk of diabetes.   Currently, no consensus exists regarding use of hemoglobin A1c for diagnosis of diabetes in children.      Mean Plasma Glucose 120 mg/dL  TSH     Status: None   Collection Time: 10/18/15 12:40 PM  Result Value Ref Range   TSH 1.25 mIU/L    Comment:   Reference Range   > or = 20 Years  0.40-4.50   Pregnancy Range First trimester  0.26-2.66 Second trimester 0.55-2.73 Third trimester  0.43-2.91     Lipid panel     Status: Abnormal   Collection Time: 10/18/15 12:40 PM  Result Value Ref Range   Cholesterol 192 125 - 200 mg/dL   Triglycerides 95 <150 mg/dL   HDL 44 (L) >=46 mg/dL   Total CHOL/HDL Ratio 4.4 <=5.0 Ratio   VLDL 19 <30 mg/dL   LDL Cholesterol 129 <130 mg/dL    Comment:   Total Cholesterol/HDL Ratio:CHD Risk                        Coronary Heart Disease Risk Table                                        Men       Women          1/2 Average Risk              3.4        3.3              Average Risk              5.0        4.4           2X Average Risk  9.6        7.1           3X Average Risk             23.4       11.0 Use the calculated Patient Ratio above and the CHD Risk table  to determine the patient's CHD Risk.   RPR     Status: None   Collection Time: 10/18/15 12:40 PM  Result Value Ref Range   RPR  Ser Ql NON REAC NON REAC  HIV antibody (with reflex)     Status: None   Collection Time: 10/18/15 12:40 PM  Result Value Ref Range   HIV 1&2 Ab, 4th Generation NONREACTIVE NONREACTIVE    Comment:   HIV-1 antigen and HIV-1/HIV-2 antibodies were not detected.  There is no laboratory evidence of HIV infection.   HIV-1/2 Antibody Diff        Not indicated. HIV-1 RNA, Qual TMA          Not indicated.     PLEASE NOTE: This information has been disclosed to you from records whose confidentiality may be protected by state law. If your state requires such protection, then the state law prohibits you from making any further disclosure of the information without the specific written consent of the person to whom it pertains, or as otherwise permitted by law. A general authorization for the release of medical or other information is NOT sufficient for this purpose.   The performance of this assay has not been clinically validated in patients less than 69 years old.   For additional information please refer to http://education.[facility].com/faq/FAQ106.  (This link is being provided for informational/educational purposes only.)     Hepatitis C Antibody     Status: None   Collection Time: 10/18/15 12:40 PM  Result Value Ref Range   HCV Ab NEGATIVE NEGATIVE  Vitamin D (25 hydroxy)     Status: None   Collection Time: 10/18/15 12:40 PM  Result Value Ref Range   Vit D, 25-Hydroxy 36 30 - 100 ng/mL    Comment: Vitamin D Status           25-OH Vitamin D        Deficiency                <20 ng/mL        Insufficiency         20 - 29 ng/mL        Optimal             > or = 30 ng/mL   For 25-OH Vitamin D testing on patients on D2-supplementation and patients for whom quantitation of D2 and D3 fractions is required, the [facility] 25-OH VIT D, (D2,D3), LC/MS/MS is recommended: order code [phone] (patients > 2 yrs).        Impression and Recommendations:     Overweight (BMI  25.0-29.9) Encouraged wt loss and positive lifestyle changes to ward off becoming a diabetic  Glucose intolerance (impaired glucose tolerance) Hemoglobin A1c, screening for diabetes, shows the patient is in the prediabetic range. I recommend diet and lifestyle modification and we can recheck this in 6 months.   Long d/c pt re: low carbs, high protein, high veggies and some fruits.  Move more   Essential hypertension At goal for age.  Counseling done with pt   1. Screening for breast cancer  - MM Digital Screening; Future - MM Digital Screening    Orders  Placed This Encounter  Procedures  . MM Digital Screening    Standing Status: Future     Number of Occurrences: 1     Standing Expiration Date: 12/31/2016    Order Specific Question:  Reason for Exam (SYMPTOM  OR DIAGNOSIS REQUIRED)    Answer:  screening    Order Specific Question:  Preferred imaging location?    Answer:  External    2) LABS: everything r/w pt in detail; all Q's answered  Vitamin D levels normal, thyroid function normal, CBC normal.  Hemoglobin A1c, screening for diabetes, shows the patient is in the prediabetic range. I recommend diet and lifestyle modification and we can recheck this in 6 months.   Her creatinine level, so slightly elevated, and I would not worry about this. Drink more water and Repeat in 6 months.  Her HDL, or good cholesterol, is just a little low. I recommend patient engage in moderate intensity aerobic activity at least 5 days per week to help improve this.    She also can increase her intake of foods rich in omega-3 fatty acids.  3) reassured pt that I could not feel or see any abn on her scalp  Note: This document was prepared using Dragon voice recognition software and may include unintentional dictation errors.

## 2015-11-04 DIAGNOSIS — R7302 Impaired glucose tolerance (oral): Secondary | ICD-10-CM | POA: Insufficient documentation

## 2015-11-04 NOTE — Assessment & Plan Note (Signed)
Hemoglobin A1c, screening for diabetes, shows the patient is in the prediabetic range. I recommend diet and lifestyle modification and we can recheck this in 6 months.   Long d/c pt re: low carbs, high protein, high veggies and some fruits.  Move more

## 2015-11-04 NOTE — Assessment & Plan Note (Signed)
Encouraged wt loss and positive lifestyle changes to ward off becoming a diabetic

## 2015-11-04 NOTE — Assessment & Plan Note (Signed)
At goal for age.  Counseling done with pt

## 2015-11-06 ENCOUNTER — Telehealth: Payer: Self-pay | Admitting: Family Medicine

## 2015-11-06 NOTE — Telephone Encounter (Signed)
Pamela Reynolds,   Please call and remind patient to go for her mammogram. Thank you.

## 2015-11-21 ENCOUNTER — Ambulatory Visit (INDEPENDENT_AMBULATORY_CARE_PROVIDER_SITE_OTHER): Payer: BLUE CROSS/BLUE SHIELD | Admitting: Pharmacist

## 2015-11-21 VITALS — BP 138/82 | Wt 177.6 lb

## 2015-11-21 DIAGNOSIS — I10 Essential (primary) hypertension: Secondary | ICD-10-CM

## 2015-11-21 NOTE — Patient Instructions (Signed)
Return for a a follow up appointment with Dr. Gwenlyn Found  Your blood pressure today is 132/82   Check your blood pressure at home daily (if able) and keep record of the readings.  Take your BP meds as follows: Continue same medications  Bring all of your meds, your BP cuff and your record of home blood pressures to your next appointment.  Exercise as you're able, try to walk approximately 30 minutes per day.  Keep salt intake to a minimum, especially watch canned and prepared boxed foods.  Eat more fresh fruits and vegetables and fewer canned items.  Avoid eating in fast food restaurants.    HOW TO TAKE YOUR BLOOD PRESSURE: . Rest 5 minutes before taking your blood pressure. .  Don't smoke or drink caffeinated beverages for at least 30 minutes before. . Take your blood pressure before (not after) you eat. . Sit comfortably with your back supported and both feet on the floor (don't cross your legs). . Elevate your arm to heart level on a table or a desk. . Use the proper sized cuff. It should fit smoothly and snugly around your bare upper arm. There should be enough room to slip a fingertip under the cuff. The bottom edge of the cuff should be 1 inch above the crease of the elbow. . Ideally, take 3 measurements at one sitting and record the average.

## 2015-11-21 NOTE — Progress Notes (Signed)
Patient ID: Pamela Reynolds                 DOB: 10-Jan-1956                      MRN: [id number]     HPI: Pamela Reynolds is a 60 y.o. female patient of Dr. Gwenlyn Reynolds who presents today for hypertension follow-up. She had a spell of low blood pressure about a month ago and her clonidine was stopped at that time. She reports doing well since the previous episode with low blood pressure. She believes this spell may have been related to stress and environment. She denies any other symptoms of low blood pressure at this time.   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 She believes this is why her blood pressure is elevated in the office today.   She also reports that she has been going to a new primary care physician and has had great blood pressure readings in their office.   She also had the first of her thumb operations and will have the stitches removed tomorrow. Once this hand is healed enough she will have the other hand operated on.   Current HTN meds:  Metoprolol 50mg  BID Spironolactone 25mg  in the evening Valsartan/HCT 160/25 mg in the morning  Previously tried: clonidine - tapered off  BP goal: <140/90  Cardiac Hx: grade 1 diastolic dysfunction, hypertension  Family Hx: mother just started taking meds for hypertension at 81; son has started to notice increase in pressures, but not currently on medication  Social Hx: no tobacco; rare alcohol, maximum of 1 cup of coffee per day  Diet: cooks most foods at home, using fresh ingredients, no boxed or frozen prepared foods. No canned foods. Does not add salt when cooking or at table; has been eating even better, as they are cooking meals to take to her mother-in-law on a regular basis  Exercise: none  Home BP readings:  She reports average is 120/75 and she has not seen measurements >140 at all.   She uses a wrist cuff that was  previously calibrated by Tommy Medal, PharmD.   Wt Readings from Last 3 Encounters:  11/01/15 177 lb 1.6 oz (80.332 kg)  10/18/15 181 lb 4.8 oz (82.237 kg)  10/11/15 179 lb (81.194 kg)   BP Readings from Last 3 Encounters:  11/01/15 136/83  10/18/15 113/75  10/11/15 118/74   Pulse Readings from Last 3 Encounters:  11/01/15 70  10/18/15 66  10/01/15 57    Renal function: CrCl cannot be calculated (Unknown ideal weight.).  Past Medical History  Diagnosis Date  . History of chicken pox   . Cardiac dysrhythmia, unspecified In distant past    S/P workup, was told this was benign  . Special screening for malignant neoplasms, colon   . Persistent cough     with URI, prev. trx'd with Hycodan  . NSVD (normal spontaneous vaginal delivery) 1979 & [id number]    No complications  . Shortness of breath     Current Outpatient Prescriptions on File Prior to Visit  Medication Sig Dispense Refill  . chlorpheniramine-HYDROcodone (TUSSIONEX) 10-8 MG/5ML SUER Take 5 mLs by mouth every 12 (twelve) hours as needed for cough (cough, will cause drowsiness.). 200 mL 0  . citalopram (CELEXA) 20 MG tablet TAKE 1/2 TO 1 TABLET BY MOUTH  ONCE A DAY 90 tablet 1  . metoprolol (LOPRESSOR) 50 MG tablet TAKE 1 TABLET BY MOUTH 2 TIMES A DAY. 180 tablet 3  . Multiple Vitamin (MULTIVITAMIN WITH MINERALS) TABS tablet Take 1 tablet by mouth daily. Reported on 09/08/2015    . spironolactone (ALDACTONE) 25 MG tablet Take 1 tablet (25 mg total) by mouth daily. 90 tablet 3  . valsartan-hydrochlorothiazide (DIOVAN-HCT) 160-25 MG tablet Take 1 tablet by mouth daily. 30 tablet 6   No current facility-administered medications on file prior to visit.    Allergies  Allergen Reactions  . Amlodipine Besylate     REACTION: edema and rash on bilateral ankles  . Lisinopril     REACTION: presumed ACE cough     Assessment/Plan: Hypertension: BP is higher than her reported home blood pressures, but still remains under her  goal <140/90. This may be secondary to stressors experienced this morning. Will keep with her current regimen since she remains controlled. She will follow up with Dr. Gwenlyn Reynolds as scheduled and call with any concerns or if blood pressure trends up.    Thank you, Lelan Pons. Patterson Hammersmith, [facility]  11/21/2015 10:29 AM

## 2015-11-21 NOTE — Assessment & Plan Note (Signed)
BP is higher than her reported home blood pressures, but still remains under her goal <140/90. This may be secondary to stressors experienced this morning. Will keep with her current regimen since she remains controlled. She will follow up with Dr. Gwenlyn Found as scheduled and call with any concerns or if blood pressure trends up.

## 2015-12-21 ENCOUNTER — Encounter (HOSPITAL_BASED_OUTPATIENT_CLINIC_OR_DEPARTMENT_OTHER): Payer: Self-pay | Admitting: *Deleted

## 2015-12-21 ENCOUNTER — Emergency Department (HOSPITAL_BASED_OUTPATIENT_CLINIC_OR_DEPARTMENT_OTHER)
Admission: EM | Admit: 2015-12-21 | Discharge: 2015-12-21 | Disposition: A | Discharge status: Home / Self Care | Payer: BLUE CROSS/BLUE SHIELD | Attending: Emergency Medicine | Admitting: Emergency Medicine

## 2015-12-21 ENCOUNTER — Emergency Department (HOSPITAL_BASED_OUTPATIENT_CLINIC_OR_DEPARTMENT_OTHER): Payer: BLUE CROSS/BLUE SHIELD

## 2015-12-21 DIAGNOSIS — R109 Unspecified abdominal pain: Secondary | ICD-10-CM | POA: Diagnosis present

## 2015-12-21 DIAGNOSIS — I10 Essential (primary) hypertension: Secondary | ICD-10-CM | POA: Insufficient documentation

## 2015-12-21 DIAGNOSIS — R0602 Shortness of breath: Secondary | ICD-10-CM | POA: Diagnosis not present

## 2015-12-21 DIAGNOSIS — Z79899 Other long term (current) drug therapy: Secondary | ICD-10-CM | POA: Insufficient documentation

## 2015-12-21 DIAGNOSIS — K2970 Gastritis, unspecified, without bleeding: Secondary | ICD-10-CM | POA: Insufficient documentation

## 2015-12-21 LAB — HEPATIC FUNCTION PANEL
ALBUMIN: 4.3 g/dL (ref 3.5–5.0)
ALK PHOS: 80 U/L (ref 38–126)
ALT: 18 U/L (ref 14–54)
AST: 21 U/L (ref 15–41)
BILIRUBIN TOTAL: 1 mg/dL (ref 0.3–1.2)
Bilirubin, Direct: 0.1 mg/dL (ref 0.1–0.5)
Indirect Bilirubin: 0.9 mg/dL (ref 0.3–0.9)
Total Protein: 7.4 g/dL (ref 6.5–8.1)

## 2015-12-21 LAB — CBC
HCT: 40 % (ref 36.0–46.0)
Hemoglobin: 13.7 g/dL (ref 12.0–15.0)
MCH: 29.9 pg (ref 26.0–34.0)
MCHC: 34.3 g/dL (ref 30.0–36.0)
MCV: 87.3 fL (ref 78.0–100.0)
PLATELETS: 279 10*3/uL (ref 150–400)
RBC: 4.58 MIL/uL (ref 3.87–5.11)
RDW: 13.3 % (ref 11.5–15.5)
WBC: 8 10*3/uL (ref 4.0–10.5)

## 2015-12-21 LAB — TROPONIN I: Troponin I: <0.03 ng/mL (ref <0.03)

## 2015-12-21 LAB — BASIC METABOLIC PANEL
ANION GAP: 10 (ref 5–15)
BUN: 19 mg/dL (ref 6–20)
CO2: 26 mmol/L (ref 22–32)
Calcium: 9.6 mg/dL (ref 8.9–10.3)
Chloride: 101 mmol/L (ref 101–111)
Creatinine, Ser: 1.01 mg/dL — ABNORMAL HIGH (ref 0.44–1.00)
GFR calc Af Amer: >60 mL/min (ref >60)
GFR calc non Af Amer: 58 mL/min — ABNORMAL LOW (ref >60)
Glucose, Bld: 96 mg/dL (ref 65–99)
POTASSIUM: 3.9 mmol/L (ref 3.5–5.1)
SODIUM: 137 mmol/L (ref 135–145)

## 2015-12-21 LAB — LIPASE, BLOOD: Lipase: 27 U/L (ref 11–51)

## 2015-12-21 LAB — D-DIMER, QUANTITATIVE: D-Dimer, Quant: 2.07 ug/mL-FEU — ABNORMAL HIGH (ref 0.00–0.50)

## 2015-12-21 MED ORDER — OMEPRAZOLE 20 MG PO CPDR: 20.0000 mg | DELAYED_RELEASE_CAPSULE | Freq: Two times a day (BID) | ORAL | 0 refills | Status: DC

## 2015-12-21 MED ORDER — FAMOTIDINE IN NACL 20-0.9 MG/50ML-% IV SOLN
20.0000 mg | Freq: Two times a day (BID) | INTRAVENOUS | Status: DC
  Administered 2015-12-21: 20 mg via INTRAVENOUS
  Filled 2015-12-21: qty 50

## 2015-12-21 MED ORDER — GI COCKTAIL ~~LOC~~
30.0000 mL | Freq: Once | ORAL | Status: AC
  Administered 2015-12-21: 30 mL via ORAL
  Filled 2015-12-21: qty 30

## 2015-12-21 MED ORDER — SUCRALFATE 1 G PO TABS: 1.0000 g | ORAL_TABLET | Freq: Four times a day (QID) | ORAL | 0 refills | Status: DC

## 2015-12-21 MED ORDER — IOPAMIDOL (ISOVUE-370) INJECTION 76%
100.0000 mL | Freq: Once | INTRAVENOUS | Status: AC | PRN
  Administered 2015-12-21: 100 mL via INTRAVENOUS

## 2015-12-21 NOTE — ED Triage Notes (Signed)
Pt states she was awakened this a.m. with CP, SHOB and nausea. Pain under right breast. Describes as deep burning.

## 2015-12-21 NOTE — ED Notes (Signed)
Pt given d/c instructions as per chart. Verbalizes understanding. No questions. Rx x 2. 

## 2015-12-21 NOTE — ED Provider Notes (Signed)
[place] DE[facility] Provider Note   CSN: [id number] Arrival date & time: 12/21/15  1434  First Provider Contact:  First MD Initiated Contact with Patient 12/21/15 1518     By signing my name below, I, Ephriam Jenkins, attest that this documentation has been prepared under the direction and in the presence of Tanna Furry, MD. Electronically signed, Ephriam Jenkins, ED Scribe. 12/21/15. 4:14 PM.   History   Chief Complaint Chief Complaint  Patient presents with  . Chest Pain    HPI HPI Comments: Pamela Reynolds is a 60 y.o. Female with a PMHx of Cholecystectomy, Appendectomy, who presents to the Emergency Department complaining of sudden onset, constant, unchanged, middle to right sided, burning chest pain that started early this morning. Pt states she woke up this morning with pain and states that the pain has been constant and radiates downwards through her stomach. Pt states the pain is under her right breast. Pt also reports intermittent diaphoresis, shortness of breath, dizziness, and nausea throughout the day today. Pt believes she is short of breath due to the discomfort in the same area of her chest upon taking deep breaths. Pt has not taken any medication in attempt to relieve symptoms PTA. Pt also reports that she does experience dyspnea of exertion when walking up stairs but states that she has had this problem for a few years; not a new problem. Pt also reports subjective swelling to her bilateral hands and states "I can't take my wedding ring off". Pt also reports an unchanged cough since January 2017. Pt reports a family Hx of cardiac issues; father died a sudden death "attributed to a blood clot that went through his heart".  Pt states she has normal kidney function as far as she knows; despite a couple UTI's. Pt recently had a stress test done with "good" results. Pt denies any pain to the left side of her chest or pain that radiates upwards. Pt denies pain or swelling in her lower  extremities. Pt denies changes in diet, alcohol intake, new medications. Pt denies Hx of DVT. Pt reports a bad fall in May and was concerned for blood clot. Pt had doppler and was normal. Pt has surgery on her right arm on June 30th. Pt is not a smoker.     Past Medical History:  Diagnosis Date  . Cardiac dysrhythmia, unspecified In distant past   S/P workup, was told this was benign  . History of chicken pox   . Hypertension   . NSVD (normal spontaneous vaginal delivery) 1979 & [id number]   No complications  . Persistent cough    with URI, prev. trx'd with Hycodan  . Shortness of breath   . Special screening for malignant neoplasms, colon     Patient Active Problem List   Diagnosis Date Noted  . Glucose intolerance (impaired glucose tolerance) 11/04/2015  . Possible exposure to STD 10/21/2015  . Persistent cough 10/21/2015  . Overweight (BMI 25.0-29.9) 10/21/2015  . Shortness of breath 06/21/2015  . FH: celiac disease 12/18/2012  . Weight gain 12/18/2012  . Anxiety 08/11/2010  . Essential hypertension 06/06/2010    Past Surgical History:  Procedure Laterality Date  . APPENDECTOMY  1973  . BREAST BIOPSY  1993  . BTL  1983  . CHOLECYSTECTOMY  2004  . CO2 LASER APPLICATION    . COLPOSCOPY    . FOOT NEUROMA SURGERY    . OOPHORECTOMY    . ovarian cysts  1973  . TUBAL LIGATION  OB History    Gravida Para Term Preterm AB Living   2 2       2    SAB TAB Ectopic Multiple Live Births                   Home Medications    Prior to Admission medications   Medication Sig Start Date End Date Taking? Authorizing Provider  Bioflavonoid Products (VITAMIN C PLUS PO) Take 2 tablets by mouth daily.    Historical Provider, MD  chlorpheniramine-HYDROcodone (TUSSIONEX) 10-8 MG/5ML SUER Take 5 mLs by mouth every 12 (twelve) hours as needed for cough (cough, will cause drowsiness.). Patient not taking: Reported on 11/21/2015 10/18/15   Mellody Dance, DO  citalopram (CELEXA) 20 MG  tablet TAKE 1/2 TO 1 TABLET BY MOUTH ONCE A DAY 07/09/15   Tonia Ghent, MD  metoprolol (LOPRESSOR) 50 MG tablet TAKE 1 TABLET BY MOUTH 2 TIMES A DAY. 01/11/15   Tonia Ghent, MD  naproxen sodium (ANAPROX) 220 MG tablet Take 220 mg by mouth as needed (pain).    Historical Provider, MD  omeprazole (PRILOSEC) 20 MG capsule Take 1 capsule (20 mg total) by mouth 2 (two) times daily. 12/21/15   Tanna Furry, MD  spironolactone (ALDACTONE) 25 MG tablet Take 1 tablet (25 mg total) by mouth daily. 08/30/15   Lorretta Harp, MD  sucralfate (CARAFATE) 1 g tablet Take 1 tablet (1 g total) by mouth 4 (four) times daily. 12/21/15   Tanna Furry, MD  valsartan-hydrochlorothiazide (DIOVAN-HCT) 160-25 MG tablet Take 1 tablet by mouth daily. 10/11/15   Lorretta Harp, MD    Family History Family History  Problem Relation Age of Onset  . Hypertension Mother   . Heart disease Father     MI  . Heart disease Maternal Grandmother   . Heart disease Paternal Grandmother   . Hypertension Paternal Grandmother   . Heart disease Paternal Grandfather   . Colon cancer Neg Hx   . Breast cancer Neg Hx     Social History Social History  Substance Use Topics  . Smoking status: Never Smoker  . Smokeless tobacco: Never Used  . Alcohol use 0.0 oz/week     Comment: Very minimal (husband with h/o ETOH abuse), rarely     Allergies   Amlodipine besylate and Lisinopril   Review of Systems Review of Systems  Constitutional: Positive for diaphoresis. Negative for appetite change, chills, fatigue and fever.  HENT: Negative for mouth sores, sore throat and trouble swallowing.   Eyes: Negative for visual disturbance.  Respiratory: Positive for shortness of breath. Negative for cough, chest tightness and wheezing.   Cardiovascular: Positive for chest pain (middle to right side; radiates downwards).  Gastrointestinal: Positive for nausea. Negative for abdominal distention, abdominal pain, diarrhea and vomiting.    Endocrine: Negative for polydipsia, polyphagia and polyuria.  Genitourinary: Negative for dysuria, frequency and hematuria.  Musculoskeletal: Negative for gait problem.  Skin: Negative for color change, pallor and rash.  Neurological: Positive for dizziness. Negative for syncope, light-headedness and headaches.  Hematological: Does not bruise/bleed easily.  Psychiatric/Behavioral: Negative for behavioral problems and confusion.  All other systems reviewed and are negative.    Physical Exam Updated Vital Signs BP 127/76   Pulse (!) 56   Temp 98 F (36.7 C) (Oral)   Resp 23   Ht 5\' 9"  (1.753 m)   Wt 175 lb (79.4 kg)   SpO2 97%   BMI 25.84 kg/m   Physical Exam  Constitutional: She is oriented to person, place, and time. She appears well-developed and well-nourished. No distress.  HENT:  Head: Normocephalic.  Eyes: Conjunctivae are normal. Pupils are equal, round, and reactive to light. No scleral icterus.  Neck: Normal range of motion. Neck supple. No thyromegaly present.  Cardiovascular: Normal rate and regular rhythm.  Exam reveals no gallop and no friction rub.   No murmur heard. Pulmonary/Chest: Effort normal and breath sounds normal. No respiratory distress. She has no wheezes. She has no rales.  Abdominal: Soft. Bowel sounds are normal. She exhibits no distension. There is no tenderness. There is no rebound.  Musculoskeletal: Normal range of motion.  Short arm cast RUE  Neurological: She is alert and oriented to person, place, and time.  Skin: Skin is warm and dry. No rash noted.  Psychiatric: She has a normal mood and affect. Her behavior is normal.     ED Treatments / Results  DIAGNOSTIC STUDIES: Oxygen Saturation is 99% on RA, normal by my interpretation.  COORDINATION OF CARE: 3:30 PM-Will wait for BMP results. Discussed treatment plan with pt at bedside and pt agreed to plan.   Labs (all labs ordered are listed, but only abnormal results are  displayed) Labs Reviewed  BASIC METABOLIC PANEL - Abnormal; Notable for the following:       Result Value   Creatinine, Ser 1.01 (*)    GFR calc non Af Amer 58 (*)    All other components within normal limits  D-DIMER, QUANTITATIVE (NOT AT [facility]) - Abnormal; Notable for the following:    D-Dimer, Quant 2.07 (*)    All other components within normal limits  TROPONIN I  CBC  HEPATIC FUNCTION PANEL  LIPASE, BLOOD    EKG  EKG Interpretation None       Radiology Dg Chest 2 View  Result Date: 12/21/2015 CLINICAL DATA:  Mid-sternal chest pain, epigastric pain, since this am, with SOBPt states she had an xray earlier this spring and was told she had a spot on her lung, unsure which side or where her xrays were taken, states she took antibiotics and was supposed to have a follow up xray last month and did not have that done No hx heart or lung disease, non smoker EXAM: CHEST  2 VIEW COMPARISON:  None. FINDINGS: The heart is normal in size and configuration. The aorta is mildly uncoiled. No mediastinal or hilar masses or evidence of adenopathy. Clear lungs.  No pleural effusion or pneumothorax. Bony thorax is demineralized but intact. IMPRESSION: No active cardiopulmonary disease. Electronically Signed   By: Lajean Manes M.D.   On: 12/21/2015 15:51   Ct Angio Chest Pe W Or Wo Contrast  Result Date: 12/21/2015 CLINICAL DATA:  Pt with mid-sternal chest pain, epigastric pain and SOB this am, recent wrist sx 11/08/15, was treated for potential DVT 5/17 was on blood thinner Elevated DDimer EXAM: CT ANGIOGRAPHY CHEST WITH CONTRAST TECHNIQUE: Multidetector CT imaging of the chest was performed using the standard protocol during bolus administration of intravenous contrast. Multiplanar CT image reconstructions and MIPs were obtained to evaluate the vascular anatomy. CONTRAST:  100 mL of Isovue 370 intravenous contrast COMPARISON:  Current chest radiograph. FINDINGS: Angiographic study: No evidence of a  pulmonary embolism. Great vessels are normal in caliber. No aortic dissection. Minimal calcified atherosclerotic plaque along the thoracic aorta. Neck base and axilla: Mild enlargement of the right thyroid lobe by an 18 mm nodule. No neck base or axillary masses or adenopathy. Mediastinum and  hila: Heart normal in size. No coronary artery calcifications. No mediastinal or hilar masses or enlarged lymph nodes. Lungs and pleura: Minimal dependent subsegmental atelectasis. Otherwise clear. No pleural effusion or pneumothorax. Limited upper abdomen: Small nonobstructing stone in the upper pole the right kidney. No acute findings. Musculoskeletal: Bones are mildly demineralized. There are degenerative changes along the thoracic spine. No osteoblastic or osteolytic lesions. Review of the MIP images confirms the above findings. IMPRESSION: 1. No evidence of a pulmonary embolus. 2. No acute findings.  No evidence of pneumonia or pulmonary edema. Electronically Signed   By: Lajean Manes M.D.   On: 12/21/2015 16:35    Procedures Procedures (including critical care time)  Medications Ordered in ED Medications  famotidine (PEPCID) IVPB 20 mg premix (0 mg Intravenous Stopped 12/21/15 1658)  gi cocktail (Maalox,Lidocaine,Donnatal) (30 mLs Oral Given 12/21/15 1552)  iopamidol (ISOVUE-370) 76 % injection 100 mL (100 mLs Intravenous Contrast Given 12/21/15 1615)     Initial Impression / Assessment and Plan / ED Course  I have reviewed the triage vital signs and the nursing notes.  Pertinent labs & imaging results that were available during my care of the patient were reviewed by me and considered in my medical decision making (see chart for details).  Clinical Course    Patient got relief with antiacids. Positive d-dimer, negative CT angiogram. Symptoms most consistent with acid related phenomenon. Ulcer, or gastritis/duodenitis. We discussed treatment. Avoiding large meals and 6. She does not use  anti-inflammatories. Does not smoke does not drink. Avoid caffeine. Prilosec Carafate prescriptions. Primary care follow-up.  Final Clinical Impressions(s) / ED Diagnoses   Final diagnoses:  Gastritis    New Prescriptions New Prescriptions   OMEPRAZOLE (PRILOSEC) 20 MG CAPSULE    Take 1 capsule (20 mg total) by mouth 2 (two) times daily.   SUCRALFATE (CARAFATE) 1 G TABLET    Take 1 tablet (1 g total) by mouth 4 (four) times daily.  I personally performed the services described in this documentation, which was scribed in my presence. The recorded information has been reviewed and is accurate.    Tanna Furry, MD 12/21/15 1710

## 2016-01-09 ENCOUNTER — Encounter: Payer: Self-pay | Admitting: Cardiovascular Disease

## 2016-01-09 ENCOUNTER — Telehealth: Payer: Self-pay | Admitting: Cardiovascular Disease

## 2016-01-09 NOTE — Telephone Encounter (Signed)
Closed encounter °

## 2016-01-24 ENCOUNTER — Ambulatory Visit: Payer: BLUE CROSS/BLUE SHIELD | Admitting: Cardiovascular Disease

## 2016-02-09 DIAGNOSIS — M8580 Other specified disorders of bone density and structure, unspecified site: Secondary | ICD-10-CM

## 2016-02-09 HISTORY — DX: Other specified disorders of bone density and structure, unspecified site: M85.80

## 2016-02-12 ENCOUNTER — Ambulatory Visit: Payer: BLUE CROSS/BLUE SHIELD | Admitting: Cardiovascular Disease

## 2016-02-17 ENCOUNTER — Other Ambulatory Visit: Payer: Self-pay | Admitting: *Deleted

## 2016-02-17 MED ORDER — METOPROLOL TARTRATE 50 MG PO TABS: 50.0000 mg | ORAL_TABLET | Freq: Two times a day (BID) | ORAL | 1 refills | Status: DC

## 2016-03-04 ENCOUNTER — Encounter: Payer: Self-pay | Admitting: Cardiovascular Disease

## 2016-03-04 ENCOUNTER — Ambulatory Visit (INDEPENDENT_AMBULATORY_CARE_PROVIDER_SITE_OTHER): Payer: BLUE CROSS/BLUE SHIELD | Admitting: Cardiovascular Disease

## 2016-03-04 DIAGNOSIS — Z23 Encounter for immunization: Secondary | ICD-10-CM

## 2016-03-04 DIAGNOSIS — I10 Essential (primary) hypertension: Secondary | ICD-10-CM

## 2016-03-04 MED ORDER — METOPROLOL TARTRATE 50 MG PO TABS: 50.0000 mg | ORAL_TABLET | Freq: Two times a day (BID) | ORAL | 3 refills | Status: DC

## 2016-03-04 NOTE — Assessment & Plan Note (Signed)
History of hypertension blood pressure measured at 112/72. She is on metoprolol, Aldactone, Diovan and hydrochlorothiazide. Continue current meds at current dosing

## 2016-03-04 NOTE — Patient Instructions (Signed)
Medication Instructions:  Continue current medications.   Testing/Procedures: You were given a FLU SHOT today.  Follow-Up: Your physician wants you to follow-up in: [place].  You will receive a reminder letter in the mail two months in advance. If you don't receive a letter, please call our office to schedule the follow-up appointment.   If you need a refill on your cardiac medications before your next appointment, please call your pharmacy.

## 2016-03-04 NOTE — Progress Notes (Signed)
03/04/2016 Pamela Reynolds   Aug 07, 1955  [id number]  Primary Physician Pamela Dance, DO Primary Cardiologist: Pamela Harp MD Pamela Reynolds  HPI:  Pamela Reynolds is a 60 year old mild to moderately overweight married Caucasian female mother of 2 children from her prior marriage, grandmother to 2 grandchildren who is self-referred for evaluation of persistent hypertension and progressive shortness of breath. I last saw her in the office 08/16/15 She works as  a Building control surveyor. Her primary care physician is Pamela Reynolds She does not smoke. Her father did die of sudden cardiac death at age 36. She's had hypertension for the last 20 years which has been more poorly controlled of late. She is on 4 antihypertensive medications. She complains of generalized fatigue and increasing shortness of breath over the last 6-12 months with some occasional chest tightness. A 2-D echo performed 07/09/15 revealed normal LV function with grade 1 diastolic dysfunction. Renal Dopplers performed 07/19/15 showed no evidence of renal artery stenosis. We have been titrating adjusting her antihypertensive medications were finally gotten her blood pressure under good control. She is asymptomatic currently.   Current Outpatient Prescriptions  Medication Sig Dispense Refill  . Bioflavonoid Products (VITAMIN C PLUS PO) Take 2 tablets by mouth daily.    . chlorpheniramine-HYDROcodone (TUSSIONEX) 10-8 MG/5ML SUER Take 5 mLs by mouth every 12 (twelve) hours as needed for cough (cough, will cause drowsiness.). 200 mL 0  . citalopram (CELEXA) 20 MG tablet TAKE 1/2 TO 1 TABLET BY MOUTH ONCE A DAY 90 tablet 1  . metoprolol (LOPRESSOR) 50 MG tablet Take 1 tablet (50 mg total) by mouth 2 (two) times daily. 180 tablet 3  . naproxen sodium (ANAPROX) 220 MG tablet Take 220 mg by mouth as needed (pain).    Marland Kitchen spironolactone (ALDACTONE) 25 MG tablet Take 1 tablet (25 mg total) by mouth daily. 90 tablet 3  .  valsartan-hydrochlorothiazide (DIOVAN-HCT) 160-25 MG tablet Take 1 tablet by mouth daily. 30 tablet 6   No current facility-administered medications for this visit.     Allergies  Allergen Reactions  . Amlodipine Besylate     REACTION: edema and rash on bilateral ankles  . Lisinopril     REACTION: presumed ACE cough    Social History   Social History  . Marital status: Married    Spouse name: N/A  . Number of children: 2  . Years of education: N/A   Occupational History  . 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 Smoking status: Never Smoker  . Smokeless tobacco: Never Used  . Alcohol use 0.0 oz/week     Comment: Very minimal (husband with h/o ETOH abuse), rarely  . Drug use: No  . Sexual activity: Yes   Other Topics Concern  . Not on file   Social History Narrative   From [place] state.  Married 2000, second marriage.  2 grown kids, 2 grandkids.   Dental hygienist.       Review of Systems: General: negative for chills, fever, night sweats or weight changes.  Cardiovascular: negative for chest pain, dyspnea on exertion, edema, orthopnea, palpitations, paroxysmal nocturnal dyspnea or shortness of breath Dermatological: negative for rash Respiratory: negative for cough or wheezing Urologic: negative for hematuria Abdominal: negative for nausea, vomiting, diarrhea, bright red blood per rectum, melena, or hematemesis Neurologic: negative for visual changes, syncope, or dizziness All other systems reviewed and are otherwise  negative except as noted above.    Blood pressure 112/72, pulse 65, height 5\' 6"  (1.676 m), weight 180 lb (81.6 kg).  General appearance: alert and no distress Neck: no adenopathy, no carotid bruit, no JVD, supple, symmetrical, trachea midline and thyroid not enlarged, symmetric, no tenderness/mass/nodules Lungs: clear to auscultation bilaterally Heart: regular rate and rhythm, S1,  S2 normal, no murmur, click, rub or gallop Extremities: extremities normal, atraumatic, no cyanosis or edema  EKG not performed today  ASSESSMENT AND PLAN:   Essential hypertension History of hypertension blood pressure measured at 112/72. She is on metoprolol, Aldactone, Diovan and hydrochlorothiazide. Continue current meds at current dosing      Pamela Harp MD [facility], [facility] 03/04/2016 3:35 PM

## 2016-03-10 ENCOUNTER — Encounter: Payer: Self-pay | Admitting: Gynecology

## 2016-03-10 ENCOUNTER — Other Ambulatory Visit: Payer: Self-pay | Admitting: Gynecology

## 2016-03-10 ENCOUNTER — Ambulatory Visit (INDEPENDENT_AMBULATORY_CARE_PROVIDER_SITE_OTHER): Payer: BLUE CROSS/BLUE SHIELD

## 2016-03-10 DIAGNOSIS — M8580 Other specified disorders of bone density and structure, unspecified site: Secondary | ICD-10-CM

## 2016-03-10 DIAGNOSIS — Z13820 Encounter for screening for osteoporosis: Secondary | ICD-10-CM | POA: Diagnosis not present

## 2016-04-07 ENCOUNTER — Ambulatory Visit: Admission: RE | Admit: 2016-04-07 | Payer: BLUE CROSS/BLUE SHIELD | Source: Ambulatory Visit

## 2016-04-07 ENCOUNTER — Ambulatory Visit
Admission: RE | Admit: 2016-04-07 | Discharge: 2016-04-07 | Disposition: A | Discharge status: Home / Self Care | Payer: BLUE CROSS/BLUE SHIELD | Source: Ambulatory Visit | Attending: Family Medicine | Admitting: Family Medicine

## 2016-04-07 ENCOUNTER — Other Ambulatory Visit: Payer: Self-pay | Admitting: Family Medicine

## 2016-04-07 DIAGNOSIS — R059 Cough, unspecified: Secondary | ICD-10-CM

## 2016-04-07 DIAGNOSIS — R05 Cough: Secondary | ICD-10-CM

## 2016-04-09 ENCOUNTER — Other Ambulatory Visit: Payer: Self-pay | Admitting: *Deleted

## 2016-04-09 NOTE — Telephone Encounter (Signed)
Electronic refill request. Last Filled:     90 tablet 1 07/09/2015  Note was made:  Erskine Emery  Thu Nov 21, 2015 10:33 AM (EDT) Has been taking 1/2 tablet daily - plans to try to get off Please advise.

## 2016-04-10 ENCOUNTER — Other Ambulatory Visit: Payer: Self-pay

## 2016-04-10 NOTE — Telephone Encounter (Signed)
Please verify current dosing with patient.  Thanks.

## 2016-04-14 ENCOUNTER — Other Ambulatory Visit: Payer: Self-pay

## 2016-04-14 NOTE — Telephone Encounter (Signed)
You have not prescribed this medication previously.  Please address refill request.  Charyl Bigger, CMA

## 2016-04-14 NOTE — Telephone Encounter (Signed)
Actually I see a history of anxiety in her chart but his was last assessed by her prior PCP- Tonia Ghent, MD on 07/10/2015.  She will need an office visit with me.

## 2016-04-14 NOTE — Telephone Encounter (Signed)
I have never assessed patient for any mood disorder- no diagnosis in her chart of depression or anxiety etc.  She never told me about this condition- only seen her twice and we have never discussed why she is on citalopram etc.   Give #60 only, NO refills. We will need to see patient in the near future to discuss and document why she is taking this medicine, and assess how her mood is doing..  - thnx

## 2016-04-15 MED ORDER — CITALOPRAM HYDROBROMIDE 20 MG PO TABS: ORAL_TABLET | ORAL | 0 refills | Status: DC

## 2016-04-15 NOTE — Telephone Encounter (Signed)
Pt stated that she wishes to try to wean herself off of this medication, but does need the refill at this time.  Advised pt that if she feels that she needs this in the future, to call to schedule OV.  Pt expressed understanding and is agreeable.  Charyl Bigger, CMA

## 2016-06-03 ENCOUNTER — Other Ambulatory Visit: Payer: Self-pay | Admitting: Cardiovascular Disease

## 2016-06-04 NOTE — Telephone Encounter (Signed)
Rx(s) sent to pharmacy electronically.  

## 2016-09-30 ENCOUNTER — Other Ambulatory Visit: Payer: Self-pay | Admitting: Cardiovascular Disease

## 2016-09-30 DIAGNOSIS — Z79899 Other long term (current) drug therapy: Secondary | ICD-10-CM

## 2016-09-30 NOTE — Telephone Encounter (Signed)
REFILL 

## 2017-03-31 ENCOUNTER — Other Ambulatory Visit: Payer: Self-pay | Admitting: Cardiovascular Disease

## 2017-04-05 NOTE — Telephone Encounter (Signed)
Rx(s) sent to pharmacy electronically.  

## 2017-04-06 ENCOUNTER — Telehealth: Payer: Self-pay | Admitting: Cardiovascular Disease

## 2017-04-06 NOTE — Telephone Encounter (Signed)
Called patient and left a VM to call back to schedule yearly followup with Dr. Gwenlyn Found.

## 2017-04-09 ENCOUNTER — Ambulatory Visit (INDEPENDENT_AMBULATORY_CARE_PROVIDER_SITE_OTHER): Payer: Self-pay | Admitting: Cardiovascular Disease

## 2017-04-09 ENCOUNTER — Encounter: Payer: Self-pay | Admitting: Cardiovascular Disease

## 2017-04-09 DIAGNOSIS — I10 Essential (primary) hypertension: Secondary | ICD-10-CM

## 2017-04-09 DIAGNOSIS — Z79899 Other long term (current) drug therapy: Secondary | ICD-10-CM

## 2017-04-09 MED ORDER — SPIRONOLACTONE 25 MG PO TABS: 25.0000 mg | ORAL_TABLET | Freq: Every day | ORAL | 3 refills | Status: DC

## 2017-04-09 MED ORDER — VALSARTAN-HYDROCHLOROTHIAZIDE 160-25 MG PO TABS: 1.0000 | ORAL_TABLET | Freq: Every day | ORAL | 3 refills | Status: DC

## 2017-04-09 MED ORDER — METOPROLOL TARTRATE 50 MG PO TABS: 50.0000 mg | ORAL_TABLET | Freq: Two times a day (BID) | ORAL | 3 refills | Status: DC

## 2017-04-09 NOTE — Assessment & Plan Note (Signed)
History of essential hypertension blood pressure measured at 106/80. She is on metoprolol, Aldactone, Diovan and hydrochlorothiazide. Continue current meds at current dosing.

## 2017-04-09 NOTE — Progress Notes (Signed)
04/09/2017 Pamela Reynolds   1955/05/14  [id number]  Primary Physician Pamela Dance, DO Primary Cardiologist: Pamela Harp MD Pamela Reynolds, [place]  HPI:  Pamela Reynolds is a 61 y.o.  mild to moderately overweight married Caucasian female mother of 2 children from her prior marriage, grandmother to 2 grandchildren who is self-referred for evaluation of persistent hypertension and progressive shortness of breath. I last saw her in the office 03/04/16  She works as  a Building control surveyor. Her primary care physician is Pamela Reynolds She does not smoke. Her father did die of sudden cardiac death at age 49. She's had hypertension for the last 20 years which has been more poorly controlled of late. She is on 4 antihypertensive medications. She complains of generalized fatigue and increasing shortness of breath over the last 6-12 months with some occasional chest tightness. A 2-D echo performed 07/09/15 revealed normal LV function with grade 1 diastolic dysfunction. Renal Dopplers performed 07/19/15 showed no evidence of renal artery stenosis. We have been titrating adjusting her antihypertensive medications were finally gotten her blood pressure under good control. She is asymptomatic currently.   Current Meds  Medication Sig  . citalopram (CELEXA) 20 MG tablet TAKE 1/2 TO 1 TABLET BY MOUTH ONCE A DAY  . metoprolol tartrate (LOPRESSOR) 50 MG tablet Take 1 tablet (50 mg total) by mouth 2 (two) times daily. PLEASE CONTACT OFFICE FOR ADDITIONAL REFILLS  . naproxen sodium (ANAPROX) 220 MG tablet Take 220 mg by mouth as needed (pain).  Marland Kitchen spironolactone (ALDACTONE) 25 MG tablet TAKE 1 TABLET (25 MG TOTAL) BY MOUTH DAILY.  . valsartan-hydrochlorothiazide (DIOVAN-HCT) 160-25 MG tablet TAKE 1 TABLET BY MOUTH DAILY.     Allergies  Allergen Reactions  . Amlodipine Besylate     REACTION: edema and rash on bilateral ankles  . Lisinopril     REACTION: presumed ACE cough    Social History    Socioeconomic History  . Marital status: Married    Spouse name: Not on file  . Number of children: 2  . Years of education: Not on file  . Highest education level: Not on file  Social Needs  . Financial resource strain: Not on file  . Food insecurity - worry: Not on file  . Food insecurity - inability: Not on file  . Transportation needs - medical: Not on file  . Transportation needs - non-medical: Not on file  Occupational History  . 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 Smoking status: Never Smoker  . Smokeless tobacco: Never Used  Substance and Sexual Activity  . Alcohol use: Yes    Alcohol/week: 0.0 oz    Comment: Very minimal (husband with h/o ETOH abuse), rarely  . Drug use: No  . Sexual activity: Yes  Other Topics Concern  . Not on file  Social History Narrative   From [place] state.  Married 2000, second marriage.  2 grown kids, 2 grandkids.   Dental hygienist.       Review of Systems: General: negative for chills, fever, night sweats or weight changes.  Cardiovascular: negative for chest pain, dyspnea on exertion, edema, orthopnea, palpitations, paroxysmal nocturnal dyspnea or shortness of breath Dermatological: negative for rash Respiratory: negative for cough or wheezing Urologic: negative for hematuria Abdominal: negative for nausea, vomiting, diarrhea, bright red blood per rectum, melena, or hematemesis Neurologic: negative for visual changes, syncope,  or dizziness All other systems reviewed and are otherwise negative except as noted above.    Blood pressure 106/80, pulse 72, height 5\' 6"  (1.676 m), weight 186 lb 6.4 oz (84.6 kg).  General appearance: alert and no distress Neck: no adenopathy, no carotid bruit, no JVD, supple, symmetrical, trachea midline and thyroid not enlarged, symmetric, no tenderness/mass/nodules Lungs: clear to auscultation bilaterally Heart:  regular rate and rhythm, S1, S2 normal, no murmur, click, rub or gallop Extremities: extremities normal, atraumatic, no cyanosis or edema Pulses: 2+ and symmetric Skin: Skin color, texture, turgor normal. No rashes or lesions Neurologic: Alert and oriented X 3, normal strength and tone. Normal symmetric reflexes. Normal coordination and gait  EKG not performed today  ASSESSMENT AND PLAN:   Essential hypertension History of essential hypertension blood pressure measured at 106/80. She is on metoprolol, Aldactone, Diovan and hydrochlorothiazide. Continue current meds at current dosing.  Shortness of breath History of shortness of breath the past which is no longer an issue. She did have a Myoview stress test and 2-D echo of which were normal.      Pamela Harp MD [facility], [facility] 04/09/2017 3:36 PM

## 2017-04-09 NOTE — Patient Instructions (Signed)
Medication Instructions: Your physician recommends that you continue on your current medications as directed. Please refer to the Current Medication list given to you today.   Follow-Up: Your physician recommends that you schedule a follow-up appointment as needed with Dr. Berry.  If you need a refill on your cardiac medications before your next appointment, please call your pharmacy.  

## 2017-04-09 NOTE — Assessment & Plan Note (Signed)
History of shortness of breath the past which is no longer an issue. She did have a Myoview stress test and 2-D echo of which were normal.

## 2018-04-15 ENCOUNTER — Encounter: Payer: Self-pay | Admitting: Family Medicine

## 2018-04-15 ENCOUNTER — Ambulatory Visit (INDEPENDENT_AMBULATORY_CARE_PROVIDER_SITE_OTHER): Payer: Self-pay | Admitting: Family Medicine

## 2018-04-15 VITALS — BP 134/82 | HR 62 | Temp 98.0°F | Ht 66.0 in | Wt 184.0 lb

## 2018-04-15 DIAGNOSIS — R7302 Impaired glucose tolerance (oral): Secondary | ICD-10-CM

## 2018-04-15 DIAGNOSIS — F419 Anxiety disorder, unspecified: Secondary | ICD-10-CM

## 2018-04-15 DIAGNOSIS — E663 Overweight: Secondary | ICD-10-CM

## 2018-04-15 DIAGNOSIS — Z79899 Other long term (current) drug therapy: Secondary | ICD-10-CM

## 2018-04-15 DIAGNOSIS — I10 Essential (primary) hypertension: Secondary | ICD-10-CM

## 2018-04-15 MED ORDER — SPIRONOLACTONE 25 MG PO TABS: 25.0000 mg | ORAL_TABLET | Freq: Every day | ORAL | 1 refills | Status: DC

## 2018-04-15 MED ORDER — VALSARTAN-HYDROCHLOROTHIAZIDE 160-25 MG PO TABS: 1.0000 | ORAL_TABLET | Freq: Every day | ORAL | 1 refills | Status: DC

## 2018-04-15 MED ORDER — METOPROLOL TARTRATE 50 MG PO TABS: 50.0000 mg | ORAL_TABLET | Freq: Two times a day (BID) | ORAL | 1 refills | Status: DC

## 2018-04-15 NOTE — Progress Notes (Signed)
New patient office visit note:  Impression and Recommendations:    1. Essential hypertension   2. Anxiety   3. Glucose intolerance (impaired glucose tolerance)   4. Medication management   5. Overweight (BMI 25.0-29.9)     1. Essential HTN:  -Patient tolerating her Valsartan-HCTZ, aldactone, and lopressor well -Patient needs a refill of these medications, to which I will refill today.   2. Anxiety -She is off of her Celexa at this time and tolerating well.    Follow up in 6 months for fasting labs, lipid panel, TSH, and OV following    Education and routine counseling performed. Handouts provided.    Do not remove these below:  Orders Placed This Encounter  Procedures  . CBC with Differential/Platelet  . Basic metabolic panel     Meds ordered this encounter  Medications  . spironolactone (ALDACTONE) 25 MG tablet    Sig: Take 1 tablet (25 mg total) by mouth daily.    Dispense:  90 tablet    Refill:  1  . metoprolol tartrate (LOPRESSOR) 50 MG tablet    Sig: Take 1 tablet (50 mg total) by mouth 2 (two) times daily.    Dispense:  180 tablet    Refill:  1  . valsartan-hydrochlorothiazide (DIOVAN-HCT) 160-25 MG tablet    Sig: Take 1 tablet by mouth daily.    Dispense:  90 tablet    Refill:  1    Medications Discontinued During This Encounter  Medication Reason  . naproxen sodium (ANAPROX) 220 MG tablet Completed Course  . citalopram (CELEXA) 20 MG tablet Discontinued by provider  . spironolactone (ALDACTONE) 25 MG tablet Reorder  . metoprolol tartrate (LOPRESSOR) 50 MG tablet Reorder  . valsartan-hydrochlorothiazide (DIOVAN-HCT) 160-25 MG tablet Reorder      Gross side effects, risk and benefits, and alternatives of medications discussed with patient.  Patient is aware that all medications have potential side effects and we are unable to predict every side effect or drug-drug interaction that may occur.  Expresses verbal understanding and consents to  current therapy plan and treatment regimen.  Return for 12mo- for CPE - will get full set of FBW so come fasting.  Please see AVS handed out to patient at the end of our visit for further patient instructions/ counseling done pertaining to today's office visit.    Note:  This document was prepared using Dragon voice recognition software and may include unintentional dictation errors.  This document serves as a record of services personally performed by Mellody Dance, DO. It was created on her behalf by Steva Colder, a trained medical scribe. The creation of this record is based on the scribe's personal observations and the provider's statements to them.   I have reviewed the above medical documentation for accuracy and completeness and I concur.  Mellody Dance, DO 04/19/2018 12:40 PM         ---------------------------------------------------------------------------------------------------------------------------------------------------------------------------------------------    Subjective:    Chief complaint:   Chief Complaint  Patient presents with  . Establish Care     HPI: Pamela Reynolds is a pleasant 62 y.o. female who presents to [place] [facility] today to review their medical history with me and establish care.   I asked the patient to review their chronic problem list with me to ensure everything was updated and accurate.    All recent office visits with other providers, any medical records that patient brought in etc  - I reviewed today.  We asked pt to get [place] their medical records from [facility] providers/ specialists that they had seen within the past 3-5 years- if they are in private practice and/or do not work for [employer], [facility], [place], [place] or [employer] owned practice.  Told them to call their specialists to clarify this if they are not sure.   She has been evaluated in the past with another provider, Dr. Gwenlyn Found, Cardiologist. She was  "let go" by Dr. Alvester Chou She is here for a medication refill. She has a GYN specialist, [name], NP.  She notes that she has recently moved. She notes that her husband lost his job a while ago. Her mood is doing well at this time and she is off Celexa. She was pre-osteopenia on her last bone density scan 3 years ago.    She has issues with clearing her throat intermittently and often has to stop talking to clear her throat.   She denies HA, palpations, chest tightness, and any other symptoms.   She hasn't followed up with an orthopedist or dermatologist (last visit in 2017 and had a normal body skin check).   Her last colonoscopy was 07/11/2010 and she was cleared for 10 years. She received her flu vaccination through [facility] for free.    Wt Readings from Last 3 Encounters:  04/15/18 184 lb (83.5 kg)  04/09/17 186 lb 6.4 oz (84.6 kg)  03/04/16 180 lb (81.6 kg)   BP Readings from Last 3 Encounters:  04/15/18 134/82  04/09/17 106/80  03/04/16 112/72   Pulse Readings from Last 3 Encounters:  04/15/18 62  04/09/17 72  03/04/16 65   BMI Readings from Last 3 Encounters:  04/15/18 29.70 kg/m  04/09/17 30.09 kg/m  03/04/16 29.05 kg/m    Patient Care Team    Relationship Specialty Notifications Start End  Mellody Dance, DO PCP - General Family Medicine  10/18/15     Patient Active Problem List   Diagnosis Date Noted  . Medication management 04/19/2018  . Glucose intolerance (impaired glucose tolerance) 11/04/2015  . Possible exposure to STD 10/21/2015  . Persistent cough 10/21/2015  . Overweight (BMI 25.0-29.9) 10/21/2015  . Shortness of breath 06/21/2015  . FH: celiac disease 12/18/2012  . Weight gain 12/18/2012  . Anxiety 08/11/2010  . Essential hypertension 06/06/2010       As reported by pt:  Past Medical History:  Diagnosis Date  . Cardiac dysrhythmia, unspecified In distant past   S/P workup, was told this was benign  . History of chicken  pox   . Hypertension   . NSVD (normal spontaneous vaginal delivery) 1979 & 2458   No complications  . Osteopenia 02/2016   T score of -1.6 FRAX 8.8%/0.9%  . Persistent cough    with URI, prev. trx'd with Hycodan  . Shortness of breath   . Special screening for malignant neoplasms, colon      Past Surgical History:  Procedure Laterality Date  . APPENDECTOMY  1973  . BREAST BIOPSY  1993  . BTL  1983  . CHOLECYSTECTOMY  2004  . CO2 LASER APPLICATION    . COLPOSCOPY    . FOOT NEUROMA SURGERY    . OOPHORECTOMY    . ovarian cysts  1973  . TUBAL LIGATION       Family History  Problem Relation Age of Onset  . Hypertension Mother   . Heart disease Father        MI  . Heart disease  Maternal Grandmother   . Heart disease Paternal Grandmother   . Hypertension Paternal Grandmother   . Heart disease Paternal Grandfather   . Colon cancer Neg Hx   . Breast cancer Neg Hx      Social History   Substance and Sexual Activity  Drug Use No     Social History   Substance and Sexual Activity  Alcohol Use Yes  . Alcohol/week: 0.0 standard drinks   Comment: Very minimal (husband with h/o ETOH abuse), rarely     Social History   Tobacco Use  Smoking Status Never Smoker  Smokeless Tobacco Never Used     Current Meds  Medication Sig  . metoprolol tartrate (LOPRESSOR) 50 MG tablet Take 1 tablet (50 mg total) by mouth 2 (two) times daily.  Marland Kitchen spironolactone (ALDACTONE) 25 MG tablet Take 1 tablet (25 mg total) by mouth daily.  . valsartan-hydrochlorothiazide (DIOVAN-HCT) 160-25 MG tablet Take 1 tablet by mouth daily.  . [DISCONTINUED] metoprolol tartrate (LOPRESSOR) 50 MG tablet Take 1 tablet (50 mg total) by mouth 2 (two) times daily.  . [DISCONTINUED] spironolactone (ALDACTONE) 25 MG tablet Take 1 tablet (25 mg total) by mouth daily.  . [DISCONTINUED] valsartan-hydrochlorothiazide (DIOVAN-HCT) 160-25 MG tablet Take 1 tablet by mouth daily.    Allergies: Amlodipine  besylate and Lisinopril   Review of Systems  Constitutional: Negative for chills, diaphoresis, fever, malaise/fatigue and weight loss.  HENT: Negative for congestion, sore throat and tinnitus.   Eyes: Negative for blurred vision, double vision and photophobia.  Respiratory: Negative for cough and wheezing.   Cardiovascular: Negative for chest pain and palpitations.  Gastrointestinal: Negative for blood in stool, diarrhea, nausea and vomiting.  Genitourinary: Negative for dysuria, frequency and urgency.  Musculoskeletal: Negative for joint pain and myalgias.  Skin: Negative for itching and rash.  Neurological: Negative for dizziness, focal weakness, weakness and headaches.  Endo/Heme/Allergies: Negative for environmental allergies and polydipsia. Does not bruise/bleed easily.  Psychiatric/Behavioral: Negative for depression and memory loss. The patient is not nervous/anxious and does not have insomnia.         Objective:   Blood pressure 134/82, pulse 62, temperature 98 F (36.7 C), height 5\' 6"  (1.676 m), weight 184 lb (83.5 kg), SpO2 100 %. Body mass index is 29.7 kg/m. General: Well Developed, well nourished, and in no acute distress.  Neuro: Alert and oriented x3, extra-ocular muscles intact, sensation grossly intact.  HEENT:[place]/AT, PERRLA, neck supple, No carotid bruits Skin: no gross rashes  Cardiac: Regular rate and rhythm Respiratory: Essentially clear to auscultation bilaterally. Not using accessory muscles, speaking in full sentences.  Abdominal: not grossly distended Musculoskeletal: Ambulates w/o diff, FROM * 4 ext.  Vasc: less 2 sec cap RF, warm and pink  Psych:  No HI/SI, judgement and insight good, Euthymic mood. Full Affect.    Recent Results (from the past 2160 hour(s))  CBC with Differential/Platelet     Status: None   Collection Time: 04/15/18 10:56 AM  Result Value Ref Range   WBC 7.2 3.4 - 10.8 x10E3/uL   RBC 4.88 3.77 - 5.28 x10E6/uL   Hemoglobin 14.1  11.1 - 15.9 g/dL   Hematocrit 43.0 34.0 - 46.6 %   MCV 88 79 - 97 fL   MCH 28.9 26.6 - 33.0 pg   MCHC 32.8 31.5 - 35.7 g/dL   RDW 13.0 12.3 - 15.4 %   Platelets 254 150 - 450 x10E3/uL   Neutrophils 61 Not Estab. %   Lymphs 32 Not Estab. %  Monocytes 5 Not Estab. %   Eos 2 Not Estab. %   Basos 0 Not Estab. %   Neutrophils Absolute 4.4 1.4 - 7.0 x10E3/uL   Lymphocytes Absolute 2.3 0.7 - 3.1 x10E3/uL   Monocytes Absolute 0.4 0.1 - 0.9 x10E3/uL   EOS (ABSOLUTE) 0.1 0.0 - 0.4 x10E3/uL   Basophils Absolute 0.0 0.0 - 0.2 x10E3/uL   Immature Granulocytes 0 Not Estab. %   Immature Grans (Abs) 0.0 0.0 - 0.1 [id number]  Basic metabolic panel     Status: Abnormal   Collection Time: 04/15/18 10:56 AM  Result Value Ref Range   Glucose 85 65 - 99 mg/dL   BUN 24 8 - 27 mg/dL   Creatinine, Ser 1.03 (H) 0.57 - 1.00 mg/dL   GFR calc non Af Amer 58 (L) >59 mL/min/1.73   GFR calc Af Amer 66 >59 mL/min/1.73   BUN/Creatinine Ratio 23 12 - 28   Sodium 143 134 - 144 mmol/L   Potassium 4.0 3.5 - 5.2 mmol/L   Chloride 100 96 - 106 mmol/L   CO2 25 20 - 29 mmol/L   Calcium 9.8 8.7 - 10.3 mg/dL

## 2018-04-15 NOTE — Patient Instructions (Signed)
Your goal blood pressure should be 135/85 or less on a regular basis, or medications should be started/ modified.    Normal blood pressure is less than 120/80.    Hypertension Hypertension, commonly called high blood pressure, is when the force of blood pumping through the arteries is too strong. The arteries are the blood vessels that carry blood from the heart throughout the body. Hypertension forces the heart to work harder to pump blood and may cause arteries to become narrow or stiff. Having untreated or uncontrolled hypertension can cause heart attacks, strokes, kidney disease, and other problems. A blood pressure reading consists of a higher number over a lower number. Ideally, your blood pressure should be below 120/80. The first ("top") number is called the systolic pressure. It is a measure of the pressure in your arteries as your heart beats. The second ("bottom") number is called the diastolic pressure. It is a measure of the pressure in your arteries as the heart relaxes. What are the causes? The cause of this condition is not known. What increases the risk? Some risk factors for high blood pressure are under your control. Others are not. Factors you can change  Smoking.  Having type 2 diabetes mellitus, high cholesterol, or both.  Not getting enough exercise or physical activity.  Being overweight.  Having too much fat, sugar, calories, or salt (sodium) in your diet.  Drinking too much alcohol. Factors that are difficult or impossible to change  Having chronic kidney disease.  Having a family history of high blood pressure.  Age. Risk increases with age.  Race. You may be at higher risk if you are African-American.  Gender. Men are at higher risk than women before age 45. After age 75, women are at higher risk than men.  Having obstructive sleep apnea.  Stress. What are the signs or symptoms? Extremely high blood pressure (hypertensive crisis) may  cause:  Headache.  Anxiety.  Shortness of breath.  Nosebleed.  Nausea and vomiting.  Severe chest pain.  Jerky movements you cannot control (seizures).  How is this diagnosed? This condition is diagnosed by measuring your blood pressure while you are seated, with your arm resting on a surface. The cuff of the blood pressure monitor will be placed directly against the skin of your upper arm at the level of your heart. It should be measured at least twice using the same arm. Certain conditions can cause a difference in blood pressure between your right and left arms. Certain factors can cause blood pressure readings to be lower or higher than normal (elevated) for a short period of time:  When your blood pressure is higher when you are in a health care provider's office than when you are at home, this is called white coat hypertension. Most people with this condition do not need medicines.  When your blood pressure is higher at home than when you are in a health care provider's office, this is called masked hypertension. Most people with this condition may need medicines to control blood pressure.  If you have a high blood pressure reading during one visit or you have normal blood pressure with other risk factors:  You may be asked to return on a different day to have your blood pressure checked again.  You may be asked to monitor your blood pressure at home for 1 week or longer.  If you are diagnosed with hypertension, you may have other blood or imaging tests to help your health care provider understand  your overall risk for other conditions. How is this treated? This condition is treated by making healthy lifestyle changes, such as eating healthy foods, exercising more, and reducing your alcohol intake. Your health care provider may prescribe medicine if lifestyle changes are not enough to get your blood pressure under control, and if:  Your systolic blood pressure is above  130.  Your diastolic blood pressure is above 80.  Your personal target blood pressure may vary depending on your medical conditions, your age, and other factors. Follow these instructions at home: Eating and drinking  Eat a diet that is high in fiber and potassium, and low in sodium, added sugar, and fat. An example eating plan is called the DASH (Dietary Approaches to Stop Hypertension) diet. To eat this way: ? Eat plenty of fresh fruits and vegetables. Try to fill half of your plate at each meal with fruits and vegetables. ? Eat whole grains, such as whole wheat pasta, brown rice, or whole grain bread. Fill about one quarter of your plate with whole grains. ? Eat or drink low-fat dairy products, such as skim milk or low-fat yogurt. ? Avoid fatty cuts of meat, processed or cured meats, and poultry with skin. Fill about one quarter of your plate with lean proteins, such as fish, chicken without skin, beans, eggs, and tofu. ? Avoid premade and processed foods. These tend to be higher in sodium, added sugar, and fat.  Reduce your daily sodium intake. Most people with hypertension should eat less than 1,500 mg of sodium a day.  Limit alcohol intake to no more than 1 drink a day for nonpregnant women and 2 drinks a day for men. One drink equals 12 oz of beer, 5 oz of wine, or 1 oz of hard liquor. Lifestyle  Work with your health care provider to maintain a healthy body weight or to lose weight. Ask what an ideal weight is for you.  Get at least 30 minutes of exercise that causes your heart to beat faster (aerobic exercise) most days of the week. Activities may include walking, swimming, or biking.  Include exercise to strengthen your muscles (resistance exercise), such as pilates or lifting weights, as part of your weekly exercise routine. Try to do these types of exercises for 30 minutes at least 3 days a week.  Do not use any products that contain nicotine or tobacco, such as cigarettes and  e-cigarettes. If you need help quitting, ask your health care provider.  Monitor your blood pressure at home as told by your health care provider.  Keep all follow-up visits as told by your health care provider. This is important. Medicines  Take over-the-counter and prescription medicines only as told by your health care provider. Follow directions carefully. Blood pressure medicines must be taken as prescribed.  Do not skip doses of blood pressure medicine. Doing this puts you at risk for problems and can make the medicine less effective.  Ask your health care provider about side effects or reactions to medicines that you should watch for. Contact a health care provider if:  You think you are having a reaction to a medicine you are taking.  You have headaches that keep coming back (recurring).  You feel dizzy.  You have swelling in your ankles.  You have trouble with your vision. Get help right away if:  You develop a severe headache or confusion.  You have unusual weakness or numbness.  You feel faint.  You have severe pain in your chest  or abdomen.  You vomit repeatedly.  You have trouble breathing. Summary  Hypertension is when the force of blood pumping through your arteries is too strong. If this condition is not controlled, it may put you at risk for serious complications.  Your personal target blood pressure may vary depending on your medical conditions, your age, and other factors. For most people, a normal blood pressure is less than 120/80.  Hypertension is treated with lifestyle changes, medicines, or a combination of both. Lifestyle changes include weight loss, eating a healthy, low-sodium diet, exercising more, and limiting alcohol. This information is not intended to replace advice given to you by your health care provider. Make sure you discuss any questions you have with your health care provider. Document Released: 04/27/2005 Document Revised: 03/25/2016  Document Reviewed: 03/25/2016 Elsevier Interactive Patient Education  2018 [employer].    How to Take Your Blood Pressure   Blood pressure is a measurement of how strongly your blood is pressing against the walls of your arteries. Arteries are blood vessels that carry blood from your heart throughout your body. Your health care provider takes your blood pressure at each office visit. You can also take your own blood pressure at home with a blood pressure machine. You may need to take your own blood pressure:  To confirm a diagnosis of high blood pressure (hypertension).  To monitor your blood pressure over time.  To make sure your blood pressure medicine is working.  Supplies needed: To take your blood pressure, you will need a blood pressure machine. You can buy a blood pressure machine, or blood pressure monitor, at most drugstores or online. There are several types of home blood pressure monitors. When choosing one, consider the following:  Choose a monitor that has an arm cuff.  Choose a monitor that wraps snugly around your upper arm. You should be able to fit only one finger between your arm and the cuff.  Do not choose a monitor that measures your blood pressure from your wrist or finger.  Your health care provider can suggest a reliable monitor that will meet your needs. How to prepare To get the most accurate reading, avoid the following for 30 minutes before you check your blood pressure:  Drinking caffeine.  Drinking alcohol.  Eating.  Smoking.  Exercising.  Five minutes before you check your blood pressure:  Empty your bladder.  Sit quietly without talking in a dining chair, rather than in a soft couch or armchair.  How to take your blood pressure To check your blood pressure, follow the instructions in the manual that came with your blood pressure monitor. If you have a digital blood pressure monitor, the instructions may be as follows: 1. Sit up  straight. 2. Place your feet on the floor. Do not cross your ankles or legs. 3. Rest your left arm at the level of your heart on a table or desk or on the arm of a chair. 4. Pull up your shirt sleeve. 5. Wrap the blood pressure cuff around the upper part of your left arm, 1 inch (2.5 cm) above your elbow. It is best to wrap the cuff around bare skin. 6. Fit the cuff snugly around your arm. You should be able to place only one finger between the cuff and your arm. 7. Position the cord inside the groove of your elbow. 8. Press the power button. 9. Sit quietly while the cuff inflates and deflates. 10. Read the digital reading on the monitor  screen and write it down (record it). 11. Wait 2-3 minutes, then repeat the steps, starting at step 1.  What does my blood pressure reading mean? A blood pressure reading consists of a higher number over a lower number. Ideally, your blood pressure should be below 120/80. The first ("top") number is called the systolic pressure. It is a measure of the pressure in your arteries as your heart beats. The second ("bottom") number is called the diastolic pressure. It is a measure of the pressure in your arteries as the heart relaxes. Blood pressure is classified into four stages. The following are the stages for adults who do not have a short-term serious illness or a chronic condition. Systolic pressure and diastolic pressure are measured in a unit called mm Hg. Normal  Systolic pressure: below 997.  Diastolic pressure: below 80. Elevated  Systolic pressure: 741-423.  Diastolic pressure: below 80. Hypertension stage 1  Systolic pressure: 953-202.  Diastolic pressure: 33-43. Hypertension stage 2  Systolic pressure: 568 or above.  Diastolic pressure: 90 or above. You can have prehypertension or hypertension even if only the systolic or only the diastolic number in your reading is higher than normal. Follow these instructions at home:  Check your blood  pressure as often as recommended by your health care provider.  Take your monitor to the next appointment with your health care provider to make sure: ? That you are using it correctly. ? That it provides accurate readings.  Be sure you understand what your goal blood pressure numbers are.  Tell your health care provider if you are having any side effects from blood pressure medicine. Contact a health care provider if:  Your blood pressure is consistently high. Get help right away if:  Your systolic blood pressure is higher than 180.  Your diastolic blood pressure is higher than 110. This information is not intended to replace advice given to you by your health care provider. Make sure you discuss any questions you have with your health care provider. Document Released: 10/04/2015 Document Revised: 12/17/2015 Document Reviewed: 10/04/2015 Elsevier Interactive Patient Education  [employer].

## 2018-04-16 LAB — BASIC METABOLIC PANEL
BUN / CREAT RATIO: 23 (ref 12–28)
BUN: 24 mg/dL (ref 8–27)
CHLORIDE: 100 mmol/L (ref 96–106)
CO2: 25 mmol/L (ref 20–29)
Calcium: 9.8 mg/dL (ref 8.7–10.3)
Creatinine, Ser: 1.03 mg/dL — ABNORMAL HIGH (ref 0.57–1.00)
GFR calc Af Amer: 66 mL/min/{1.73_m2} (ref >59)
GFR calc non Af Amer: 58 mL/min/{1.73_m2} — ABNORMAL LOW (ref >59)
GLUCOSE: 85 mg/dL (ref 65–99)
Potassium: 4 mmol/L (ref 3.5–5.2)
SODIUM: 143 mmol/L (ref 134–144)

## 2018-04-16 LAB — CBC WITH DIFFERENTIAL/PLATELET
BASOS ABS: 0 10*3/uL (ref 0.0–0.2)
Basos: 0 %
EOS (ABSOLUTE): 0.1 10*3/uL (ref 0.0–0.4)
Eos: 2 %
HEMOGLOBIN: 14.1 g/dL (ref 11.1–15.9)
Hematocrit: 43 % (ref 34.0–46.6)
Immature Grans (Abs): 0 10*3/uL (ref 0.0–0.1)
Immature Granulocytes: 0 %
LYMPHS ABS: 2.3 10*3/uL (ref 0.7–3.1)
Lymphs: 32 %
MCH: 28.9 pg (ref 26.6–33.0)
MCHC: 32.8 g/dL (ref 31.5–35.7)
MCV: 88 fL (ref 79–97)
MONOCYTES: 5 %
MONOS ABS: 0.4 10*3/uL (ref 0.1–0.9)
Neutrophils Absolute: 4.4 10*3/uL (ref 1.4–7.0)
Neutrophils: 61 %
PLATELETS: 254 10*3/uL (ref 150–450)
RBC: 4.88 x10E6/uL (ref 3.77–5.28)
RDW: 13 % (ref 12.3–15.4)
WBC: 7.2 10*3/uL (ref 3.4–10.8)

## 2018-04-19 DIAGNOSIS — Z79899 Other long term (current) drug therapy: Secondary | ICD-10-CM | POA: Insufficient documentation

## 2018-04-21 ENCOUNTER — Ambulatory Visit: Payer: Self-pay | Admitting: Family Medicine

## 2018-11-21 ENCOUNTER — Encounter: Payer: Self-pay | Admitting: Family Medicine

## 2018-11-23 ENCOUNTER — Telehealth: Payer: Self-pay | Admitting: Family Medicine

## 2018-11-23 MED ORDER — VALSARTAN-HYDROCHLOROTHIAZIDE 160-25 MG PO TABS: 1.0000 | ORAL_TABLET | Freq: Every day | ORAL | 0 refills | Status: DC

## 2018-11-23 NOTE — Addendum Note (Signed)
Addended by: Lanier Prude D on: 11/23/2018 01:48 PM   Modules accepted: Orders

## 2018-11-23 NOTE — Telephone Encounter (Signed)
Patient was supposed to have a visit with [place] Monday but there was a schedule error on the front office's part. She is out of her valsartan and we couldn't find a day that worked for our sch and hers until mid Aug. Can we please send a short supply to [facility] to get her by.

## 2018-11-23 NOTE — Telephone Encounter (Signed)
Sent 30 day in for patient. MPulliam, CMA/RT(R)

## 2018-12-08 ENCOUNTER — Other Ambulatory Visit: Payer: Self-pay

## 2018-12-08 ENCOUNTER — Telehealth: Payer: Self-pay | Admitting: Family Medicine

## 2018-12-08 DIAGNOSIS — Z79899 Other long term (current) drug therapy: Secondary | ICD-10-CM

## 2018-12-08 MED ORDER — SPIRONOLACTONE 25 MG PO TABS: 25.0000 mg | ORAL_TABLET | Freq: Every day | ORAL | 0 refills | Status: DC

## 2018-12-08 MED ORDER — METOPROLOL TARTRATE 50 MG PO TABS: 50.0000 mg | ORAL_TABLET | Freq: Two times a day (BID) | ORAL | 0 refills | Status: DC

## 2018-12-08 NOTE — Telephone Encounter (Signed)
30 days sent in patient needs OV for refills. MPulliam, CMA/RT(R)

## 2018-12-08 NOTE — Telephone Encounter (Signed)
Pt called states pharmacy has warned her no refills on file for these to meds & she has only 3 dys worth left:   1)----metoprolol tartrate (LOPRESSOR) 50 MG tablet [[id number]]   Order Details Dose: 50 mg Route: Oral Frequency: 2 times daily  Dispense Quantity: 180 tablet Refills: 1 Fills remaining: --        Sig: Take 1 tablet (50 mg total) by mouth 2 (two) times daily.           2)----- spironolactone (ALDACTONE) 25 MG tablet [[id number]]   Order Details Dose: 25 mg Route: Oral Frequency: Daily  Dispense Quantity: 90 tablet Refills: 1 Fills remaining: --        Sig: Take 1 tablet (25 mg total) by mouth daily.      ----Forwarding message to medical assistant that if approved send refill order to :   [place], [place] - [place] [phone] (Phone) [phone] (Fax)   ---glh

## 2018-12-26 ENCOUNTER — Other Ambulatory Visit: Payer: Self-pay

## 2018-12-26 DIAGNOSIS — Z0000 Encounter for general adult medical examination without abnormal findings: Secondary | ICD-10-CM

## 2018-12-26 DIAGNOSIS — R7302 Impaired glucose tolerance (oral): Secondary | ICD-10-CM

## 2018-12-26 DIAGNOSIS — I10 Essential (primary) hypertension: Secondary | ICD-10-CM

## 2018-12-27 ENCOUNTER — Ambulatory Visit (INDEPENDENT_AMBULATORY_CARE_PROVIDER_SITE_OTHER): Payer: Medicare Other | Admitting: Family Medicine

## 2018-12-27 ENCOUNTER — Encounter: Payer: Self-pay | Admitting: Family Medicine

## 2018-12-27 ENCOUNTER — Telehealth: Payer: Self-pay | Admitting: Family Medicine

## 2018-12-27 ENCOUNTER — Other Ambulatory Visit: Payer: Medicare Other

## 2018-12-27 ENCOUNTER — Other Ambulatory Visit: Payer: Self-pay

## 2018-12-27 VITALS — BP 129/75 | HR 60 | Temp 98.0°F | Ht 69.0 in | Wt 182.0 lb

## 2018-12-27 DIAGNOSIS — R221 Localized swelling, mass and lump, neck: Secondary | ICD-10-CM

## 2018-12-27 DIAGNOSIS — Z0000 Encounter for general adult medical examination without abnormal findings: Secondary | ICD-10-CM

## 2018-12-27 DIAGNOSIS — Z1239 Encounter for other screening for malignant neoplasm of breast: Secondary | ICD-10-CM

## 2018-12-27 DIAGNOSIS — R7302 Impaired glucose tolerance (oral): Secondary | ICD-10-CM | POA: Diagnosis not present

## 2018-12-27 DIAGNOSIS — Z13820 Encounter for screening for osteoporosis: Secondary | ICD-10-CM | POA: Diagnosis not present

## 2018-12-27 DIAGNOSIS — Z780 Asymptomatic menopausal state: Secondary | ICD-10-CM

## 2018-12-27 DIAGNOSIS — Z23 Encounter for immunization: Secondary | ICD-10-CM | POA: Diagnosis not present

## 2018-12-27 DIAGNOSIS — I10 Essential (primary) hypertension: Secondary | ICD-10-CM

## 2018-12-27 DIAGNOSIS — M8580 Other specified disorders of bone density and structure, unspecified site: Secondary | ICD-10-CM

## 2018-12-27 DIAGNOSIS — H9190 Unspecified hearing loss, unspecified ear: Secondary | ICD-10-CM

## 2018-12-27 NOTE — Patient Instructions (Addendum)
Preventive Care for Adults, Female  A healthy lifestyle and preventive care can promote health and wellness. Preventive health guidelines for women include the following key practices.   A routine yearly physical is a good way to check with your health care provider about your health and preventive screening. It is a chance to share any concerns and updates on your health and to receive a thorough exam.   Visit your dentist for a routine exam and preventive care every 6 months. Brush your teeth twice a day and floss once a day. Good oral hygiene prevents tooth decay and gum disease.   The frequency of eye exams is based on your age, health, family medical history, use of contact lenses, and other factors. Follow your health care provider's recommendations for frequency of eye exams.   Eat a healthy diet. Foods like vegetables, fruits, whole grains, low-fat dairy products, and lean protein foods contain the nutrients you need without too many calories. Decrease your intake of foods high in solid fats, added sugars, and salt. Eat the right amount of calories for you.Get information about a proper diet from your health care provider, if necessary.   Regular physical exercise is one of the most important things you can do for your health. Most adults should get at least 150 minutes of moderate-intensity exercise (any activity that increases your heart rate and causes you to sweat) each week. In addition, most adults need muscle-strengthening exercises on 2 or more days a week.   Maintain a healthy weight. The body mass index (BMI) is a screening tool to identify possible weight problems. It provides an estimate of body fat based on height and weight. Your health care provider can find your BMI, and can help you achieve or maintain a healthy weight.For adults 20 years and older:   - A BMI below 18.5 is considered underweight.   - A BMI of 18.5 to 24.9 is normal.   - A BMI of 25 to 29.9 is  considered overweight.   - A BMI of 30 and above is considered obese.   Maintain normal blood lipids and cholesterol levels by exercising and minimizing your intake of trans and saturated fats.  Eat a balanced diet with plenty of fruit and vegetables. Blood tests for lipids and cholesterol should begin at age 20 and be repeated every 5 years minimum.  If your lipid or cholesterol levels are high, you are over 40, or you are at high risk for heart disease, you may need your cholesterol levels checked more frequently.Ongoing high lipid and cholesterol levels should be treated with medicines if diet and exercise are not working.   If you smoke, find out from your health care provider how to quit. If you do not use tobacco, do not start.   Lung cancer screening is recommended for adults aged 55-80 years who are at high risk for developing lung cancer because of a history of smoking. A yearly low-dose CT scan of the lungs is recommended for people who have at least a 30-pack-year history of smoking and are a current smoker or have quit within the past 15 years. A pack year of smoking is smoking an average of 1 pack of cigarettes a day for 1 year (for example: 1 pack a day for 30 years or 2 packs a day for 15 years). Yearly screening should continue until the smoker has stopped smoking for at least 15 years. Yearly screening should be stopped for people who develop a   health problem that would prevent them from having lung cancer treatment.   If you are pregnant, do not drink alcohol. If you are breastfeeding, be very cautious about drinking alcohol. If you are not pregnant and choose to drink alcohol, do not have more than 1 drink per day. One drink is considered to be 12 ounces (355 mL) of beer, 5 ounces (148 mL) of wine, or 1.5 ounces (44 mL) of liquor.   Avoid use of street drugs. Do not share needles with anyone. Ask for help if you need support or instructions about stopping the use of  drugs.   High blood pressure causes heart disease and increases the risk of stroke. Your blood pressure should be checked at least yearly.  Ongoing high blood pressure should be treated with medicines if weight loss and exercise do not work.   If you are 69-55 years old, ask your health care provider if you should take aspirin to prevent strokes.   Diabetes screening involves taking a blood sample to check your fasting blood sugar level. This should be done once every 3 years, after age 38, if you are within normal weight and without risk factors for diabetes. Testing should be considered at a younger age or be carried out more frequently if you are overweight and have at least 1 risk factor for diabetes.   Breast cancer screening is essential preventive care for women. You should practice "breast self-awareness."  This means understanding the normal appearance and feel of your breasts and may include breast self-examination.  Any changes detected, no matter how small, should be reported to a health care provider.  Women in their 80s and 30s should have a clinical breast exam (CBE) by a health care provider as part of a regular health exam every 1 to 3 years.  After age 66, women should have a CBE every year.  Starting at age 1, women should consider having a mammogram (breast X-ray test) every year.  Women who have a family history of breast cancer should talk to their health care provider about genetic screening.  Women at a high risk of breast cancer should talk to their health care providers about having an MRI and a mammogram every year.   -Breast cancer gene (BRCA)-related cancer risk assessment is recommended for women who have family members with BRCA-related cancers. BRCA-related cancers include breast, ovarian, tubal, and peritoneal cancers. Having family members with these cancers may be associated with an increased risk for harmful changes (mutations) in the breast cancer genes BRCA1 and  BRCA2. Results of the assessment will determine the need for genetic counseling and BRCA1 and BRCA2 testing.   The Pap test is a screening test for cervical cancer. A Pap test can show cell changes on the cervix that might become cervical cancer if left untreated. A Pap test is a procedure in which cells are obtained and examined from the lower end of the uterus (cervix).   - Women should have a Pap test starting at age 57.   - Between ages 90 and 70, Pap tests should be repeated every 2 years.   - Beginning at age 63, you should have a Pap test every 3 years as long as the past 3 Pap tests have been normal.   - Some women have medical problems that increase the chance of getting cervical cancer. Talk to your health care provider about these problems. It is especially important to talk to your health care provider if a  new problem develops soon after your last Pap test. In these cases, your health care provider may recommend more frequent screening and Pap tests.   - The above recommendations are the same for women who have or have not gotten the vaccine for human papillomavirus (HPV).   - If you had a hysterectomy for a problem that was not cancer or a condition that could lead to cancer, then you no longer need Pap tests. Even if you no longer need a Pap test, a regular exam is a good idea to make sure no other problems are starting.   - If you are between ages 36 and 66 years, and you have had normal Pap tests going back 10 years, you no longer need Pap tests. Even if you no longer need a Pap test, a regular exam is a good idea to make sure no other problems are starting.   - If you have had past treatment for cervical cancer or a condition that could lead to cancer, you need Pap tests and screening for cancer for at least 20 years after your treatment.   - If Pap tests have been discontinued, risk factors (such as a new sexual partner) need to be reassessed to determine if screening should  be resumed.   - The HPV test is an additional test that may be used for cervical cancer screening. The HPV test looks for the virus that can cause the cell changes on the cervix. The cells collected during the Pap test can be tested for HPV. The HPV test could be used to screen women aged 70 years and older, and should be used in women of any age who have unclear Pap test results. After the age of 67, women should have HPV testing at the same frequency as a Pap test.   Colorectal cancer can be detected and often prevented. Most routine colorectal cancer screening begins at the age of 57 years and continues through age 26 years. However, your health care provider may recommend screening at an earlier age if you have risk factors for colon cancer. On a yearly basis, your health care provider may provide home test kits to check for hidden blood in the stool.  Use of a small camera at the end of a tube, to directly examine the colon (sigmoidoscopy or colonoscopy), can detect the earliest forms of colorectal cancer. Talk to your health care provider about this at age 23, when routine screening begins. Direct exam of the colon should be repeated every 5 -10 years through age 49 years, unless early forms of pre-cancerous polyps or small growths are found.   People who are at an increased risk for hepatitis B should be screened for this virus. You are considered at high risk for hepatitis B if:  -You were born in a country where hepatitis B occurs often. Talk with your health care provider about which countries are considered high risk.  - Your parents were born in a high-risk country and you have not received a shot to protect against hepatitis B (hepatitis B vaccine).  - You have HIV or AIDS.  - You use needles to inject street drugs.  - You live with, or have sex with, someone who has Hepatitis B.  - You get hemodialysis treatment.  - You take certain medicines for conditions like cancer, organ  transplantation, and autoimmune conditions.   Hepatitis C blood testing is recommended for all people born from [birth year] through 1965 and any individual  with known risks for hepatitis C.   Practice safe sex. Use condoms and avoid high-risk sexual practices to reduce the spread of sexually transmitted infections (STIs). STIs include gonorrhea, chlamydia, syphilis, trichomonas, herpes, HPV, and human immunodeficiency virus (HIV). Herpes, HIV, and HPV are viral illnesses that have no cure. They can result in disability, cancer, and death. Sexually active women aged 25 years and younger should be checked for chlamydia. Older women with new or multiple partners should also be tested for chlamydia. Testing for other STIs is recommended if you are sexually active and at increased risk.   Osteoporosis is a disease in which the bones lose minerals and strength with aging. This can result in serious bone fractures or breaks. The risk of osteoporosis can be identified using a bone density scan. Women ages 65 years and over and women at risk for fractures or osteoporosis should discuss screening with their health care providers. Ask your health care provider whether you should take a calcium supplement or vitamin D to There are also several preventive steps women can take to avoid osteoporosis and resulting fractures or to keep osteoporosis from worsening. -->Recommendations include:  Eat a balanced diet high in fruits, vegetables, calcium, and vitamins.  Get enough calcium. The recommended total intake of is 1,200 mg daily; for best absorption, if taking supplements, divide doses into 250-500 mg doses throughout the day. Of the two types of calcium, calcium carbonate is best absorbed when taken with food but calcium citrate can be taken on an empty stomach.  Get enough vitamin D. NAMS and the National Osteoporosis Foundation recommend at least 1,000 IU per day for women age 50 and over who are at risk of vitamin D  deficiency. Vitamin D deficiency can be caused by inadequate sun exposure (for example, those who live in northern latitudes).  Avoid alcohol and smoking. Heavy alcohol intake (more than 7 drinks per week) increases the risk of falls and hip fracture and women smokers tend to lose bone more rapidly and have lower bone mass than nonsmokers. Stopping smoking is one of the most important changes women can make to improve their health and decrease risk for disease.  Be physically active every day. Weight-bearing exercise (for example, fast walking, hiking, jogging, and weight training) may strengthen bones or slow the rate of bone loss that comes with aging. Balancing and muscle-strengthening exercises can reduce the risk of falling and fracture.  Consider therapeutic medications. Currently, several types of effective drugs are available. Healthcare providers can recommend the type most appropriate for each woman.  Eliminate environmental factors that may contribute to accidents. Falls cause nearly 90% of all osteoporotic fractures, so reducing this risk is an important bone-health strategy. Measures include ample lighting, removing obstructions to walking, using nonskid rugs on floors, and placing mats and/or grab bars in showers.  Be aware of medication side effects. Some common medicines make bones weaker. These include a type of steroid drug called glucocorticoids used for arthritis and asthma, some antiseizure drugs, certain sleeping pills, treatments for endometriosis, and some cancer drugs. An overactive thyroid gland or using too much thyroid hormone for an underactive thyroid can also be a problem. If you are taking these medicines, talk to your doctor about what you can do to help protect your bones.reduce the rate of osteoporosis.    Menopause can be associated with physical symptoms and risks. Hormone replacement therapy is available to decrease symptoms and risks. You should talk to your  health care provider   about whether hormone replacement therapy is right for you.   Use sunscreen. Apply sunscreen liberally and repeatedly throughout the day. You should seek shade when your shadow is shorter than you. Protect yourself by wearing long sleeves, pants, a wide-brimmed hat, and sunglasses year round, whenever you are outdoors.   Once a month, do a whole body skin exam, using a mirror to look at the skin on your back. Tell your health care provider of new moles, moles that have irregular borders, moles that are larger than a pencil eraser, or moles that have changed in shape or color.   -Stay current with required vaccines (immunizations).   Influenza vaccine. All adults should be immunized every year.  Tetanus, diphtheria, and acellular pertussis (Td, Tdap) vaccine. Pregnant women should receive 1 dose of Tdap vaccine during each pregnancy. The dose should be obtained regardless of the length of time since the last dose. Immunization is preferred during the 27th 36th week of gestation. An adult who has not previously received Tdap or who does not know her vaccine status should receive 1 dose of Tdap. This initial dose should be followed by tetanus and diphtheria toxoids (Td) booster doses every 10 years. Adults with an unknown or incomplete history of completing a 3-dose immunization series with Td-containing vaccines should begin or complete a primary immunization series including a Tdap dose. Adults should receive a Td booster every 10 years.  Varicella vaccine. An adult without evidence of immunity to varicella should receive 2 doses or a second dose if she has previously received 1 dose. Pregnant females who do not have evidence of immunity should receive the first dose after pregnancy. This first dose should be obtained before leaving the health care facility. The second dose should be obtained 4 8 weeks after the first dose.  Human papillomavirus (HPV) vaccine. Females aged 13 26  years who have not received the vaccine previously should obtain the 3-dose series. The vaccine is not recommended for use in pregnant females. However, pregnancy testing is not needed before receiving a dose. If a female is found to be pregnant after receiving a dose, no treatment is needed. In that case, the remaining doses should be delayed until after the pregnancy. Immunization is recommended for any person with an immunocompromised condition through the age of 26 years if she did not get any or all doses earlier. During the 3-dose series, the second dose should be obtained 4 8 weeks after the first dose. The third dose should be obtained 24 weeks after the first dose and 16 weeks after the second dose.  Zoster vaccine. One dose is recommended for adults aged 60 years or older unless certain conditions are present.  Measles, mumps, and rubella (MMR) vaccine. Adults born before 1957 generally are considered immune to measles and mumps. Adults born in 1957 or later should have 1 or more doses of MMR vaccine unless there is a contraindication to the vaccine or there is laboratory evidence of immunity to each of the three diseases. A routine second dose of MMR vaccine should be obtained at least 28 days after the first dose for students attending postsecondary schools, health care workers, or international travelers. People who received inactivated measles vaccine or an unknown type of measles vaccine during 1963 1967 should receive 2 doses of MMR vaccine. People who received inactivated mumps vaccine or an unknown type of mumps vaccine before 1979 and are at high risk for mumps infection should consider immunization with 2 doses of   MMR vaccine. For females of childbearing age, rubella immunity should be determined. If there is no evidence of immunity, females who are not pregnant should be vaccinated. If there is no evidence of immunity, females who are pregnant should delay immunization until after pregnancy.  Unvaccinated health care workers born before [birth year] who lack laboratory evidence of measles, mumps, or rubella immunity or laboratory confirmation of disease should consider measles and mumps immunization with 2 doses of MMR vaccine or rubella immunization with 1 dose of MMR vaccine.  Pneumococcal 13-valent conjugate (PCV13) vaccine. When indicated, a person who is uncertain of her immunization history and has no record of immunization should receive the PCV13 vaccine. An adult aged 54 years or older who has certain medical conditions and has not been previously immunized should receive 1 dose of PCV13 vaccine. This PCV13 should be followed with a dose of pneumococcal polysaccharide (PPSV23) vaccine. The PPSV23 vaccine dose should be obtained at least 8 weeks after the dose of PCV13 vaccine. An adult aged 58 years or older who has certain medical conditions and previously received 1 or more doses of PPSV23 vaccine should receive 1 dose of PCV13. The PCV13 vaccine dose should be obtained 1 or more years after the last PPSV23 vaccine dose.  Pneumococcal polysaccharide (PPSV23) vaccine. When PCV13 is also indicated, PCV13 should be obtained first. All adults aged 58 years and older should be immunized. An adult younger than age 65 years who has certain medical conditions should be immunized. Any person who resides in a nursing home or long-term care facility should be immunized. An adult smoker should be immunized. People with an immunocompromised condition and certain other conditions should receive both PCV13 and PPSV23 vaccines. People with human immunodeficiency virus (HIV) infection should be immunized as soon as possible after diagnosis. Immunization during chemotherapy or radiation therapy should be avoided. Routine use of PPSV23 vaccine is not recommended for American Indians, [place] Natives, or people younger than 65 years unless there are medical conditions that require PPSV23 vaccine. When indicated,  people who have unknown immunization and have no record of immunization should receive PPSV23 vaccine. One-time revaccination 5 years after the first dose of PPSV23 is recommended for people aged 70 64 years who have chronic kidney failure, nephrotic syndrome, asplenia, or immunocompromised conditions. People who received 1 2 doses of PPSV23 before age 32 years should receive another dose of PPSV23 vaccine at age 96 years or later if at least 5 years have passed since the previous dose. Doses of PPSV23 are not needed for people immunized with PPSV23 at or after age 55 years.  Meningococcal vaccine. Adults with asplenia or persistent complement component deficiencies should receive 2 doses of quadrivalent meningococcal conjugate (MenACWY-D) vaccine. The doses should be obtained at least 2 months apart. Microbiologists working with certain meningococcal bacteria, [employer] recruits, people at risk during an outbreak, and people who travel to or live in countries with a high rate of meningitis should be immunized. A first-year college student up through age 58 years who is living in a residence hall should receive a dose if she did not receive a dose on or after her 16th birthday. Adults who have certain high-risk conditions should receive one or more doses of vaccine.  Hepatitis A vaccine. Adults who wish to be protected from this disease, have certain high-risk conditions, work with hepatitis A-infected animals, work in hepatitis A research labs, or travel to or work in countries with a high rate of hepatitis A should be  immunized. Adults who were previously unvaccinated and who anticipate close contact with an international adoptee during the first 60 days after arrival in the [place] from a country with a high rate of hepatitis A should be immunized.  Hepatitis B vaccine.  Adults who wish to be protected from this disease, have certain high-risk conditions, may be exposed to blood or other infectious  body fluids, are household contacts or sex partners of hepatitis B positive people, are clients or workers in certain care facilities, or travel to or work in countries with a high rate of hepatitis B should be immunized.  Haemophilus influenzae type b (Hib) vaccine. A previously unvaccinated person with asplenia or sickle cell disease or having a scheduled splenectomy should receive 1 dose of Hib vaccine. Regardless of previous immunization, a recipient of a hematopoietic stem cell transplant should receive a 3-dose series 6 12 months after her successful transplant. Hib vaccine is not recommended for adults with HIV infection.  Preventive Services / Frequency Ages 6 to 39years  Blood pressure check.** / Every 1 to 2 years.  Lipid and cholesterol check.** / Every 5 years beginning at age 39.  Clinical breast exam.** / Every 3 years for women in their 61s and 62s.  BRCA-related cancer risk assessment.** / For women who have family members with a BRCA-related cancer (breast, ovarian, tubal, or peritoneal cancers).  Pap test.** / Every 2 years from ages 47 through 85. Every 3 years starting at age 34 through age 12 or 74 with a history of 3 consecutive normal Pap tests.  HPV screening.** / Every 3 years from ages 46 through ages 43 to 54 with a history of 3 consecutive normal Pap tests.  Hepatitis C blood test.** / For any individual with known risks for hepatitis C.  Skin self-exam. / Monthly.  Influenza vaccine. / Every year.  Tetanus, diphtheria, and acellular pertussis (Tdap, Td) vaccine.** / Consult your health care provider. Pregnant women should receive 1 dose of Tdap vaccine during each pregnancy. 1 dose of Td every 10 years.  Varicella vaccine.** / Consult your health care provider. Pregnant females who do not have evidence of immunity should receive the first dose after pregnancy.  HPV vaccine. / 3 doses over 6 months, if 64 and younger. The vaccine is not recommended for use in  pregnant females. However, pregnancy testing is not needed before receiving a dose.  Measles, mumps, rubella (MMR) vaccine.** / You need at least 1 dose of MMR if you were born in 1957 or later. You may also need a 2nd dose. For females of childbearing age, rubella immunity should be determined. If there is no evidence of immunity, females who are not pregnant should be vaccinated. If there is no evidence of immunity, females who are pregnant should delay immunization until after pregnancy.  Pneumococcal 13-valent conjugate (PCV13) vaccine.** / Consult your health care provider.  Pneumococcal polysaccharide (PPSV23) vaccine.** / 1 to 2 doses if you smoke cigarettes or if you have certain conditions.  Meningococcal vaccine.** / 1 dose if you are age 71 to 37 years and a Market researcher living in a residence hall, or have one of several medical conditions, you need to get vaccinated against meningococcal disease. You may also need additional booster doses.  Hepatitis A vaccine.** / Consult your health care provider.  Hepatitis B vaccine.** / Consult your health care provider.  Haemophilus influenzae type b (Hib) vaccine.** / Consult your health care provider.  Ages 55 to 64years  Blood pressure check.** / Every 1 to 2 years.  Lipid and cholesterol check.** / Every 5 years beginning at age 20 years.  Lung cancer screening. / Every year if you are aged 55 80 years and have a 30-pack-year history of smoking and currently smoke or have quit within the past 15 years. Yearly screening is stopped once you have quit smoking for at least 15 years or develop a health problem that would prevent you from having lung cancer treatment.  Clinical breast exam.** / Every year after age 40 years.  BRCA-related cancer risk assessment.** / For women who have family members with a BRCA-related cancer (breast, ovarian, tubal, or peritoneal cancers).  Mammogram.** / Every year beginning at age 40  years and continuing for as long as you are in good health. Consult with your health care provider.  Pap test.** / Every 3 years starting at age 30 years through age 65 or 70 years with a history of 3 consecutive normal Pap tests.  HPV screening.** / Every 3 years from ages 30 years through ages 65 to 70 years with a history of 3 consecutive normal Pap tests.  Fecal occult blood test (FOBT) of stool. / Every year beginning at age 50 years and continuing until age 75 years. You may not need to do this test if you get a colonoscopy every 10 years.  Flexible sigmoidoscopy or colonoscopy.** / Every 5 years for a flexible sigmoidoscopy or every 10 years for a colonoscopy beginning at age 50 years and continuing until age 75 years.  Hepatitis C blood test.** / For all people born from 1945 through 1965 and any individual with known risks for hepatitis C.  Skin self-exam. / Monthly.  Influenza vaccine. / Every year.  Tetanus, diphtheria, and acellular pertussis (Tdap/Td) vaccine.** / Consult your health care provider. Pregnant women should receive 1 dose of Tdap vaccine during each pregnancy. 1 dose of Td every 10 years.  Varicella vaccine.** / Consult your health care provider. Pregnant females who do not have evidence of immunity should receive the first dose after pregnancy.  Zoster vaccine.** / 1 dose for adults aged 60 years or older.  Measles, mumps, rubella (MMR) vaccine.** / You need at least 1 dose of MMR if you were born in 1957 or later. You may also need a 2nd dose. For females of childbearing age, rubella immunity should be determined. If there is no evidence of immunity, females who are not pregnant should be vaccinated. If there is no evidence of immunity, females who are pregnant should delay immunization until after pregnancy.  Pneumococcal 13-valent conjugate (PCV13) vaccine.** / Consult your health care provider.  Pneumococcal polysaccharide (PPSV23) vaccine.** / 1 to 2 doses if  you smoke cigarettes or if you have certain conditions.  Meningococcal vaccine.** / Consult your health care provider.  Hepatitis A vaccine.** / Consult your health care provider.  Hepatitis B vaccine.** / Consult your health care provider.  Haemophilus influenzae type b (Hib) vaccine.** / Consult your health care provider.  Ages 65 years and over  Blood pressure check.** / Every 1 to 2 years.  Lipid and cholesterol check.** / Every 5 years beginning at age 20 years.  Lung cancer screening. / Every year if you are aged 55 80 years and have a 30-pack-year history of smoking and currently smoke or have quit within the past 15 years. Yearly screening is stopped once you have quit smoking for at least 15 years or develop a health problem that   would prevent you from having lung cancer treatment.  Clinical breast exam.** / Every year after age 103 years.  BRCA-related cancer risk assessment.** / For women who have family members with a BRCA-related cancer (breast, ovarian, tubal, or peritoneal cancers).  Mammogram.** / Every year beginning at age 36 years and continuing for as long as you are in good health. Consult with your health care provider.  Pap test.** / Every 3 years starting at age 5 years through age 85 or 10 years with 3 consecutive normal Pap tests. Testing can be stopped between 65 and 70 years with 3 consecutive normal Pap tests and no abnormal Pap or HPV tests in the past 10 years.  HPV screening.** / Every 3 years from ages 93 years through ages 70 or 45 years with a history of 3 consecutive normal Pap tests. Testing can be stopped between 65 and 70 years with 3 consecutive normal Pap tests and no abnormal Pap or HPV tests in the past 10 years.  Fecal occult blood test (FOBT) of stool. / Every year beginning at age 8 years and continuing until age 45 years. You may not need to do this test if you get a colonoscopy every 10 years.  Flexible sigmoidoscopy or colonoscopy.** /  Every 5 years for a flexible sigmoidoscopy or every 10 years for a colonoscopy beginning at age 69 years and continuing until age 68 years.  Hepatitis C blood test.** / For all people born from [birth year] through 1967 and any individual with known risks for hepatitis C.  Osteoporosis screening.** / A one-time screening for women ages 7 years and over and women at risk for fractures or osteoporosis.  Skin self-exam. / Monthly.  Influenza vaccine. / Every year.  Tetanus, diphtheria, and acellular pertussis (Tdap/Td) vaccine.** / 1 dose of Td every 10 years.  Varicella vaccine.** / Consult your health care provider.  Zoster vaccine.** / 1 dose for adults aged 5 years or older.  Pneumococcal 13-valent conjugate (PCV13) vaccine.** / Consult your health care provider.  Pneumococcal polysaccharide (PPSV23) vaccine.** / 1 dose for all adults aged 74 years and older.  Meningococcal vaccine.** / Consult your health care provider.  Hepatitis A vaccine.** / Consult your health care provider.  Hepatitis B vaccine.** / Consult your health care provider.  Haemophilus influenzae type b (Hib) vaccine.** / Consult your health care provider. ** Family history and personal history of risk and conditions may change your health care provider's recommendations. Document Released: 06/23/2001 Document Revised: 02/15/2013  [facility] Patient Information 2014 [place], [place].   EXERCISE AND DIET:  We recommended that you start or continue a regular exercise program for good health. Regular exercise means any activity that makes your heart beat faster and makes you sweat.  We recommend exercising at least 30 minutes per day at least 3 days a week, preferably 5.  We also recommend a diet low in fat and sugar / carbohydrates.  Inactivity, poor dietary choices and obesity can cause diabetes, heart attack, stroke, and kidney damage, among others.     ALCOHOL AND SMOKING:  Women should limit their alcohol intake to no  more than 7 drinks/beers/glasses of wine (combined, not each!) per week. Moderation of alcohol intake to this level decreases your risk of breast cancer and liver damage.  ( And of course, no recreational drugs are part of a healthy lifestyle.)  Also, you should not be smoking at all or even being exposed to second hand smoke. Most people know smoking can  cause cancer, and various heart and lung diseases, but did you know it also contributes to weakening of your bones?  Aging of your skin?  Yellowing of your teeth and nails?   CALCIUM AND VITAMIN D:  Adequate intake of calcium and Vitamin D are recommended.  The recommendations for exact amounts of these supplements seem to change often, but generally speaking 600 mg of calcium (either carbonate or citrate) and 800 units of Vitamin D per day seems prudent. Certain women may benefit from higher intake of Vitamin D.  If you are among these women, your doctor will have told you during your visit.     PAP SMEARS:  Pap smears, to check for cervical cancer or precancers,  have traditionally been done yearly, although recent scientific advances have shown that most women can have pap smears less often.  However, every woman still should have a physical exam from her gynecologist or primary care physician every year. It will include a breast check, inspection of the vulva and vagina to check for abnormal growths or skin changes, a visual exam of the cervix, and then an exam to evaluate the size and shape of the uterus and ovaries.  And after 65 years of age, a rectal exam is indicated to check for rectal cancers. We will also provide age appropriate advice regarding health maintenance, like when you should have certain vaccines, screening for sexually transmitted diseases, bone density testing, colonoscopy, mammograms, etc.    MAMMOGRAMS:  All women over 30 years old should have a yearly mammogram. Many facilities now offer a "3D" mammogram, which may cost  around $50 extra out of pocket. If possible,  we recommend you accept the option to have the 3D mammogram performed.  It both reduces the number of women who will be called back for extra views which then turn out to be normal, and it is better than the routine mammogram at detecting truly abnormal areas.     COLONOSCOPY:  Colonoscopy to screen for colon cancer is recommended for all women at age 46.  We know, you hate the idea of the prep.  We agree, BUT, having colon cancer and not knowing it is worse!!  Colon cancer so often starts as a polyp that can be seen and removed at colonscopy, which can quite literally save your life!  And if your first colonoscopy is normal and you have no family history of colon cancer, most women don't have to have it again for 10 years.  Once every ten years, you can do something that may end up saving your life, right?  We will be happy to help you get it scheduled when you are ready.  Be sure to check your insurance coverage so you understand how much it will cost.  It may be covered as a preventative service at no cost, but you should check your particular policy.         Why follow it? Research shows. . Those who follow the Mediterranean diet have a reduced risk of heart disease  . The diet is associated with a reduced incidence of Parkinson's and Alzheimer's diseases . People following the diet may have longer life expectancies and lower rates of chronic diseases  . The Dietary Guidelines for Americans recommends the Mediterranean diet as an eating plan to promote health and prevent disease  What Is the Mediterranean Diet?  . Healthy eating plan based on typical foods and recipes of Mediterranean-style cooking . The diet is primarily  a plant based diet; these foods should make up a majority of meals   Starches - Plant based foods should make up a majority of meals - They are an important sources of vitamins, minerals, energy, antioxidants, and fiber - Choose  whole grains, foods high in fiber and minimally processed items  - Typical grain sources include wheat, oats, barley, corn, brown rice, bulgar, farro, millet, polenta, couscous  - Various types of beans include chickpeas, lentils, fava beans, black beans, white beans   Fruits  Veggies - Large quantities of antioxidant rich fruits & veggies; 6 or more servings  - Vegetables can be eaten raw or lightly drizzled with oil and cooked  - Vegetables common to the traditional Mediterranean Diet include: artichokes, arugula, beets, broccoli, brussel sprouts, cabbage, carrots, celery, collard greens, cucumbers, eggplant, kale, leeks, lemons, lettuce, mushrooms, okra, onions, peas, peppers, potatoes, pumpkin, radishes, rutabaga, shallots, spinach, sweet potatoes, turnips, zucchini - Fruits common to the Mediterranean Diet include: apples, apricots, avocados, cherries, clementines, dates, figs, grapefruits, grapes, melons, nectarines, oranges, peaches, pears, pomegranates, strawberries, tangerines  Fats - Replace butter and margarine with healthy oils, such as olive oil, canola oil, and tahini  - Limit nuts to no more than a handful a day  - Nuts include walnuts, almonds, pecans, pistachios, pine nuts  - Limit or avoid candied, honey roasted or heavily salted nuts - Olives are central to the [employer] - can be eaten whole or used in a variety of dishes   Meats Protein - Limiting red meat: no more than a few times a month - When eating red meat: choose lean cuts and keep the portion to the size of deck of cards - Eggs: approx. 0 to 4 times a week  - Fish and lean poultry: at least 2 a week  - Healthy protein sources include, chicken, [place], lean beef, lamb - Increase intake of seafood such as tuna, salmon, trout, mackerel, shrimp, scallops - Avoid or limit high fat processed meats such as sausage and bacon  Dairy - Include moderate amounts of low fat dairy products  - Focus on healthy dairy such as  fat free yogurt, skim milk, low or reduced fat cheese - Limit dairy products higher in fat such as whole or 2% milk, cheese, ice cream  Alcohol - Moderate amounts of red wine is ok  - No more than 5 oz daily for women (all ages) and men older than age 57  - No more than 10 oz of wine daily for men younger than 6  Other - Limit sweets and other desserts  - Use herbs and spices instead of salt to flavor foods  - Herbs and spices common to the traditional Mediterranean Diet include: basil, bay leaves, chives, cloves, cumin, fennel, garlic, lavender, marjoram, mint, oregano, parsley, pepper, rosemary, sage, savory, sumac, tarragon, thyme   It's not just a diet, it's a lifestyle:  . The Mediterranean diet includes lifestyle factors typical of those in the region  . Foods, drinks and meals are best eaten with others and savored . Daily physical activity is important for overall good health . This could be strenuous exercise like running and aerobics . This could also be more leisurely activities such as walking, housework, yard-work, or taking the stairs . Moderation is the key; a balanced and healthy diet accommodates most foods and drinks . Consider portion sizes and frequency of consumption of certain foods   Meal Ideas & Options:  . Breakfast:  o  Whole wheat toast or whole wheat English muffins with peanut butter & hard boiled egg o Steel cut oats topped with apples & cinnamon and skim milk  o Fresh fruit: banana, strawberries, melon, berries, peaches  o Smoothies: strawberries, bananas, greek yogurt, peanut butter o Low fat greek yogurt with blueberries and granola  o Egg white omelet with spinach and mushrooms o Breakfast couscous: whole wheat couscous, apricots, skim milk, cranberries  . Sandwiches:  o Hummus and grilled vegetables (peppers, zucchini, squash) on whole wheat bread   o Grilled chicken on whole wheat pita with lettuce, tomatoes, cucumbers or tzatziki  o Tuna salad on  whole wheat bread: tuna salad made with greek yogurt, olives, red peppers, capers, green onions o Garlic rosemary lamb pita: lamb sauted with garlic, rosemary, salt & pepper; add lettuce, cucumber, greek yogurt to pita - flavor with lemon juice and black pepper  . Seafood:  o Mediterranean grilled salmon, seasoned with garlic, basil, parsley, lemon juice and black pepper o Shrimp, lemon, and spinach whole-grain pasta salad made with low fat greek yogurt  o Seared scallops with lemon orzo  o Seared tuna steaks seasoned salt, pepper, coriander topped with tomato mixture of olives, tomatoes, olive oil, minced garlic, parsley, green onions and cappers  . Meats:  o Herbed greek chicken salad with kalamata olives, cucumber, feta  o Red bell peppers stuffed with spinach, bulgur, lean ground beef (or lentils) & topped with feta   o Kebabs: skewers of chicken, tomatoes, onions, zucchini, squash  o [place] burgers: made with red onions, mint, dill, lemon juice, feta cheese topped with roasted red peppers . Vegetarian o Cucumber salad: cucumbers, artichoke hearts, celery, red onion, feta cheese, tossed in olive oil & lemon juice  o Hummus and whole grain pita points with a greek salad (lettuce, tomato, feta, olives, cucumbers, red onion) o Lentil soup with celery, carrots made with vegetable broth, garlic, salt and pepper  o Tabouli salad: parsley, bulgur, mint, scallions, cucumbers, tomato, radishes, lemon juice, olive oil, salt and pepper.

## 2018-12-27 NOTE — Telephone Encounter (Signed)
Patient called complaining of a fullness in her neck that she is concerned about, advised patient that she may need an [place] but would send message to PCP about next step. Please advise

## 2018-12-27 NOTE — Progress Notes (Signed)
Subjective:    Pamela Reynolds Reynolds is a 63 y.o. female who presents for a Welcome to Medicare exam.   Last colonoscopy 2012 with Dr. Henrene Pastor- polyp found but was not adematous.  Thus told to repeat 10 years  Health Maintenance reviewed - colonoscopy 2012 with Dr. Henrene Pastor.   Polyp found but not mammogram ordered, patient to schedule appointment, (advised of need to have breast exam in addition to mammogram), immunizations were up-to-date or updated today.   Review of Systems Grossly negative no complaints today except for complains of right-sided neck fullness * several wks       Objective:    Today's Vitals   12/27/18 1315  BP: 129/75  Pulse: 60  Temp: 98 F (36.7 C)  SpO2: 100%  Weight: 182 lb (82.6 kg)  Height: 5\' 9"  (1.753 m)  PainSc: 0-No pain  Body mass index is 26.88 kg/m.  Medications Outpatient Encounter Medications as of 12/27/2018  Medication Sig  . calcium-vitamin D (OSCAL WITH D) 500-200 MG-UNIT tablet Take 1 tablet by mouth.  . metoprolol tartrate (LOPRESSOR) 50 MG tablet Take 1 tablet (50 mg total) by mouth 2 (two) times daily. Needs office visit for further refills  . spironolactone (ALDACTONE) 25 MG tablet Take 1 tablet (25 mg total) by mouth daily.  . valsartan-hydrochlorothiazide (DIOVAN-HCT) 160-25 MG tablet Take 1 tablet by mouth daily.   No facility-administered encounter medications on file as of 12/27/2018.      History: Past Medical History:  Diagnosis Date  . Cardiac dysrhythmia, unspecified In distant past   S/P workup, was told this was benign  . History of chicken pox   . Hypertension   . NSVD (normal spontaneous vaginal delivery) 1979 & 0973   No complications  . Osteopenia 02/2016   T score of -1.6 FRAX 8.8%/0.9%  . Persistent cough    with URI, prev. trx'd with Hycodan  . Shortness of breath   . Special screening for malignant neoplasms, colon    Past Surgical History:  Procedure Laterality Date  . APPENDECTOMY  1973  . BREAST BIOPSY  1993   . BTL  1983  . CHOLECYSTECTOMY  2004  . CO2 LASER APPLICATION    . COLPOSCOPY    . FOOT NEUROMA SURGERY    . OOPHORECTOMY    . ovarian cysts  1973  . TUBAL LIGATION      Family History  Problem Relation Age of Onset  . Hypertension Mother   . Heart attack Mother   . Heart disease Father        MI  . Heart disease Maternal Grandmother   . Heart disease Paternal Grandmother   . Hypertension Paternal Grandmother   . Heart disease Paternal Grandfather   . Colon cancer Neg Hx   . Breast cancer Neg Hx    Social History   Occupational History  . 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 Smoking status: Never Smoker  . Smokeless tobacco: Never Used  Substance and Sexual Activity  . Alcohol use: Yes    Alcohol/week: 0.0 standard drinks    Comment: Very minimal (husband with h/o ETOH abuse), rarely  . Drug use: No  . Sexual activity: Yes    Tobacco Counseling Counseling given: Not Answered   Immunizations and Health Maintenance Immunization History  Administered Date(s) Administered  . Influenza Split 05/08/2011  . Influenza,inj,Quad PF,6+ Mos 03/04/2016  .  Influenza-Unspecified 02/09/2015  . Tdap 05/08/2011   Health Maintenance Due  Topic Date Due  . PNA vac Low Risk Adult (1 of 2 - PCV13) 11/13/2018  . INFLUENZA VACCINE  12/10/2018    Activities of Daily Living In your present state of health, do you have any difficulty performing the following activities: 04/15/2018  Hearing? N  Vision? N  Difficulty concentrating or making decisions? N  Walking or climbing stairs? N  Dressing or bathing? N  Doing errands, shopping? N  Some recent data might be hidden    Advanced Directives: -cma to review if appropriate papers completed per patient preference      Assessment:    This is a routine welcome to Medicare visit  Vision/Hearing screen No exam data present  Dietary  issues and exercise activities discussed: Yes, handouts provided    Goals   None    Depression Screen PHQ 2/9 Scores 04/15/2018 11/01/2015 10/18/2015 10/01/2015  PHQ - 2 Score 0 0 0 0  PHQ- 9 Score 0 - - -     Fall Risk Fall Risk  04/15/2018  Falls in the past year? 0  Comment -  Number falls in past yr: -  Injury with Fall? -       Patient Care Team: Mellody Dance, DO as PCP - General (Family Medicine)     Plan:     -We will scanned in all paperwork from today's visit-please see those for testing results and questionnaire results.  -Spoke to [place] regarding the need for 6Cit to be done as well as Mini-Mental status exam, because of her memory concerns as noted on her sheet today.  Also we will do a hearing screen on patient due to her concerns today.  ----> Please see scanned in sheets for PHQ, MMSE, 6 cit etc. that was done for today's welcome to Medicare visit  Patient will call [name] for her 63 year old Pap smear which I explained usually we stop at 16 if it is completely normal.  -Mention to patient that if hearing worsens or she has additional concerns she can call somewhere like [place] and go to the audiologist for further testing and evaluation.  I have personally reviewed and noted the following in the patient's chart:   . Medical and social history . Use of alcohol, tobacco or illicit drugs  . Current medications and supplements . Functional ability and status . Nutritional status . Physical activity . Advanced directives . List of other physicians . Hospitalizations, surgeries, and ER visits in previous 12 months . Vitals . Screenings to include cognitive, depression, and falls . Referrals and appointments  In addition, I have reviewed and discussed with patient certain preventive protocols, quality metrics, and best practice recommendations. A written personalized care plan for preventive services as well as general preventive health recommendations  were provided to patient.    Mellody Dance, DO 12/27/2018

## 2018-12-27 NOTE — Telephone Encounter (Signed)
Order for soft tissue neck ultrasound placed.  Then we can discuss this further at her in person follow-up OV with me in 2 3 weeks.

## 2018-12-28 ENCOUNTER — Other Ambulatory Visit: Payer: Self-pay | Admitting: Family Medicine

## 2018-12-28 DIAGNOSIS — Z79899 Other long term (current) drug therapy: Secondary | ICD-10-CM

## 2018-12-28 LAB — CBC WITH DIFFERENTIAL/PLATELET
Basophils Absolute: 0 10*3/uL (ref 0.0–0.2)
Basos: 1 %
EOS (ABSOLUTE): 0.1 10*3/uL (ref 0.0–0.4)
Eos: 2 %
Hematocrit: 41.1 % (ref 34.0–46.6)
Hemoglobin: 13.6 g/dL (ref 11.1–15.9)
Immature Grans (Abs): 0 10*3/uL (ref 0.0–0.1)
Immature Granulocytes: 0 %
Lymphocytes Absolute: 1.7 10*3/uL (ref 0.7–3.1)
Lymphs: 28 %
MCH: 29.4 pg (ref 26.6–33.0)
MCHC: 33.1 g/dL (ref 31.5–35.7)
MCV: 89 fL (ref 79–97)
Monocytes Absolute: 0.5 10*3/uL (ref 0.1–0.9)
Monocytes: 8 %
Neutrophils Absolute: 3.7 10*3/uL (ref 1.4–7.0)
Neutrophils: 61 %
Platelets: 205 10*3/uL (ref 150–450)
RBC: 4.62 x10E6/uL (ref 3.77–5.28)
RDW: 12.8 % (ref 11.7–15.4)
WBC: 6 10*3/uL (ref 3.4–10.8)

## 2018-12-28 LAB — COMPREHENSIVE METABOLIC PANEL
ALT: 16 IU/L (ref 0–32)
AST: 21 IU/L (ref 0–40)
Albumin/Globulin Ratio: 2 (ref 1.2–2.2)
Albumin: 4.3 g/dL (ref 3.8–4.8)
Alkaline Phosphatase: 91 IU/L (ref 39–117)
BUN/Creatinine Ratio: 19 (ref 12–28)
BUN: 21 mg/dL (ref 8–27)
Bilirubin Total: 0.7 mg/dL (ref 0.0–1.2)
CO2: 26 mmol/L (ref 20–29)
Calcium: 10 mg/dL (ref 8.7–10.3)
Chloride: 98 mmol/L (ref 96–106)
Creatinine, Ser: 1.08 mg/dL — ABNORMAL HIGH (ref 0.57–1.00)
GFR calc Af Amer: 62 mL/min/{1.73_m2} (ref >59)
GFR calc non Af Amer: 54 mL/min/{1.73_m2} — ABNORMAL LOW (ref >59)
Globulin, Total: 2.2 g/dL (ref 1.5–4.5)
Glucose: 100 mg/dL — ABNORMAL HIGH (ref 65–99)
Potassium: 5.2 mmol/L (ref 3.5–5.2)
Sodium: 139 mmol/L (ref 134–144)
Total Protein: 6.5 g/dL (ref 6.0–8.5)

## 2018-12-28 LAB — LIPID PANEL
Chol/HDL Ratio: 4.2 ratio (ref 0.0–4.4)
Cholesterol, Total: 184 mg/dL (ref 100–199)
HDL: 44 mg/dL (ref >39)
LDL Calculated: 123 mg/dL — ABNORMAL HIGH (ref 0–99)
Triglycerides: 84 mg/dL (ref 0–149)
VLDL Cholesterol Cal: 17 mg/dL (ref 5–40)

## 2018-12-28 LAB — T4, FREE: Free T4: 1.26 ng/dL (ref 0.82–1.77)

## 2018-12-28 LAB — VITAMIN D 25 HYDROXY (VIT D DEFICIENCY, FRACTURES): Vit D, 25-Hydroxy: 41.9 ng/mL (ref 30.0–100.0)

## 2018-12-28 LAB — T3: T3, Total: 111 ng/dL (ref 71–180)

## 2018-12-28 LAB — HEMOGLOBIN A1C
Est. average glucose Bld gHb Est-mCnc: 117 mg/dL
Hgb A1c MFr Bld: 5.7 % — ABNORMAL HIGH (ref 4.8–5.6)

## 2018-12-28 LAB — TSH: TSH: 1.98 u[IU]/mL (ref 0.450–4.500)

## 2018-12-28 MED ORDER — VALSARTAN-HYDROCHLOROTHIAZIDE 160-25 MG PO TABS: 1.0000 | ORAL_TABLET | Freq: Every day | ORAL | 0 refills | Status: DC

## 2018-12-28 MED ORDER — METOPROLOL TARTRATE 50 MG PO TABS: 50.0000 mg | ORAL_TABLET | Freq: Two times a day (BID) | ORAL | 0 refills | Status: DC

## 2018-12-28 MED ORDER — SPIRONOLACTONE 25 MG PO TABS: 25.0000 mg | ORAL_TABLET | Freq: Every day | ORAL | 0 refills | Status: DC

## 2018-12-28 NOTE — Telephone Encounter (Signed)
Patient called states she forgot to ask for refills on these Rxs  :   1)--- metoprolol tartrate (LOPRESSOR) 50 MG tablet [[id number]]   Order Details Dose: 50 mg Route: Oral Frequency: 2 times daily  Dispense Quantity: 60 tablet Refills: 0 Fills remaining: --        Sig: Take 1 tablet (50 mg total) by mouth 2 (two) times daily. Needs office visit for further refills      2)--- spironolactone (ALDACTONE) 25 MG tablet [[id number]]   Order Details Dose: 25 mg Route: Oral Frequency: Daily  Dispense Quantity: 30 tablet Refills: 0 Fills remaining: --        Sig: Take 1 tablet (25 mg total) by mouth daily.       3)--- valsartan-hydrochlorothiazide (DIOVAN-HCT) 160-25 MG tablet [[id number]]   Order Details Dose: 1 tablet Route: Oral Frequency: Daily  Dispense Quantity: 30 tablet Refills: 0 Fills remaining: --        Sig: Take 1 tablet by mouth daily.          -----Forwarding request to medical assistant that if approved send  Refill order to :  ( Please call patient if any question/ concerns)  [place], [place] - [place] [phone] (Phone) [phone] (Fax)   ---glh

## 2019-01-02 ENCOUNTER — Telehealth: Payer: Self-pay

## 2019-01-02 DIAGNOSIS — E785 Hyperlipidemia, unspecified: Secondary | ICD-10-CM

## 2019-01-02 MED ORDER — ATORVASTATIN CALCIUM 20 MG PO TABS: 20.0000 mg | ORAL_TABLET | Freq: Every day | ORAL | 1 refills | Status: DC

## 2019-01-02 NOTE — Telephone Encounter (Signed)
Pt was agreeable to statin therapy.  RX sent to [facility].  Pt has f/u on 01/18/2019.  Charyl Bigger, CMA

## 2019-01-02 NOTE — Telephone Encounter (Signed)
-----   Message from Mellody Dance, DO sent at 12/29/2018  3:55 PM EDT ----- Please call and inform patient that her cholesterol is such that it requires her to go on medication.  Her 10-year atherosclerotic cardiovascular disease risk is 8% and, I recommend a lower-dose statin at this time.  Please see if patient agrees to this and if she does, please call in Lipitor 20 mg 1 p.o. nightly.  Dispense 90, 1 refill.  Please stress to patient that she will need to come for repeat labs in 4 months- please put future orders for CMP and FLP, and also will need an office visit 3 days later so we can discuss how she is doing on this new medication.    If she would feel better having a Doxy visit or telehealth visit to discuss why she needs medicines and has further concerns, that is totally fine with me   Dr Raliegh Scarlet

## 2019-01-06 ENCOUNTER — Other Ambulatory Visit: Payer: Medicare Other

## 2019-01-12 ENCOUNTER — Ambulatory Visit: Payer: Medicare Other | Admitting: Family Medicine

## 2019-01-13 ENCOUNTER — Ambulatory Visit
Admission: RE | Admit: 2019-01-13 | Discharge: 2019-01-13 | Disposition: A | Discharge status: Home / Self Care | Payer: Medicare Other | Source: Ambulatory Visit | Attending: Family Medicine | Admitting: Family Medicine

## 2019-01-13 ENCOUNTER — Other Ambulatory Visit: Payer: Self-pay | Admitting: Family Medicine

## 2019-01-13 DIAGNOSIS — R221 Localized swelling, mass and lump, neck: Secondary | ICD-10-CM

## 2019-01-13 DIAGNOSIS — E042 Nontoxic multinodular goiter: Secondary | ICD-10-CM | POA: Diagnosis not present

## 2019-01-18 ENCOUNTER — Ambulatory Visit (INDEPENDENT_AMBULATORY_CARE_PROVIDER_SITE_OTHER): Payer: Medicare Other | Admitting: Family Medicine

## 2019-01-18 ENCOUNTER — Other Ambulatory Visit: Payer: Self-pay

## 2019-01-18 ENCOUNTER — Encounter: Payer: Self-pay | Admitting: Family Medicine

## 2019-01-18 VITALS — BP 125/74 | HR 54 | Resp 16 | Ht 68.0 in | Wt 178.9 lb

## 2019-01-18 DIAGNOSIS — R1310 Dysphagia, unspecified: Secondary | ICD-10-CM | POA: Insufficient documentation

## 2019-01-18 DIAGNOSIS — R499 Unspecified voice and resonance disorder: Secondary | ICD-10-CM

## 2019-01-18 DIAGNOSIS — E785 Hyperlipidemia, unspecified: Secondary | ICD-10-CM

## 2019-01-18 DIAGNOSIS — Z23 Encounter for immunization: Secondary | ICD-10-CM | POA: Diagnosis not present

## 2019-01-18 DIAGNOSIS — E041 Nontoxic single thyroid nodule: Secondary | ICD-10-CM | POA: Diagnosis not present

## 2019-01-18 NOTE — Progress Notes (Signed)
Impression and Recommendations:    1. Thyroid nodule (s)   2. Hoarseness or changing voice   3. Dysphagia, unspecified type   4. Need for immunization against influenza   5. Hyperlipidemia, unspecified hyperlipidemia type      - Flu shot discussed and obtained today.  Hyperlipidemia - Pt agreed to start cholesterol medications. - Began statin management on 01/02/2019. - Need repeat FLP and CMP in 4 months with OV 3 days later.  Thyroid Nodules - Hoarseness, Dysphagia - Reviewed patient's recent ultrasound and results in detail during appointment today. - Long discussion was held. Extensive education provided. All questions answered.  - Thyroid labs stable last check 12/27/2018 - Will continue to monitor thyroid function closely. - Discussed need to repeat scan as recommended.  - Reviewed need for further testing such as FNA, biopsy. -  Patient declines FNA.  - Discussed possibility of need for MRI and further imaging in future which will be ordered by specialist if there is a concern for compression of adjacent tissues causing her dysphasia, hoarseness etc.  Told patient this will be at the discretion of the specialist..  - Per patient states, "even if it's benign, it's uncomfortable."   - Discussed ambulatory referral to endocrinology- patient declined to be referred today.  She understands need for repeat ultrasound in 1 year to examine other 2 nodules that are greater than 1 cm.  - Referral placed to surgical specialist per patient request as she did not want to go for FNA biopsy via ultrasound first.  See orders. - Advised patient to ask surgeon all questions regarding future procedures.    RESULTS OF RECENT SCAN OF THYROID - Obtained 01/14/2019 CLINICAL DATA:  Palpable abnormality. Right-sided neck mass. Dysphagia. Choking sensation.  EXAM: THYROID ULTRASOUND  TECHNIQUE: Ultrasound examination of the thyroid gland and adjacent soft tissues was performed.   COMPARISON:  None.  FINDINGS: Parenchymal Echotexture: Moderately heterogenous  Isthmus: Normal in size measures 0.2 cm in diameter  Right lobe: Borderline enlarged measuring 5.3 x 1.8 x 3.1 cm  Left lobe: Slightly atrophic measuring 3.9 x 0.6 x 1.1 cm  _________________________________________________________  Estimated total number of nodules >/= 1 cm: 4  Number of spongiform nodules >/=  2 cm not described below (TR1): 0  Number of mixed cystic and solid nodules >/= 1.5 cm not described below (TR2): 0  _________________________________________________________  Nodule # 1:  Location: Isthmus; Mid  Maximum size: 1.2 cm; Other 2 dimensions: 0.7 x 0.5 cm  Composition: solid/almost completely solid (2)  Echogenicity: hypoechoic (2)  Shape: not taller-than-wide (0)  Margins: ill-defined (0)  Echogenic foci: none (0)  ACR TI-RADS total points: 4.  ACR TI-RADS risk category: TR4 (4-6 points).  ACR TI-RADS recommendations:  *Given size (>/= 1 - 1.4 cm) and appearance, a follow-up ultrasound in 1 year should be considered based on TI-RADS criteria.  _________________________________________________________  Nodule # 2:  Location: Right; Mid  Maximum size: 3.5 cm; Other 2 dimensions: 2.8 x 1.8 cm  Composition: solid/almost completely solid (2)  Echogenicity: isoechoic (1)  Shape: not taller-than-wide (0)  Margins: ill-defined (0)  Echogenic foci: none (0)  ACR TI-RADS total points: 3.  ACR TI-RADS risk category: TR3 (3 points).  ACR TI-RADS recommendations:  **Given size (>/= 2.5 cm) and appearance, fine needle aspiration of this mildly suspicious nodule should be considered based on TI-RADS criteria.  _________________________________________________________  Nodule # 3:  Location: Right; Inferior  Maximum size: 1.4 cm; Other 2  dimensions: 1.2 x 0.9 cm  Composition: solid/almost completely solid (2)   Echogenicity: hypoechoic (2)  Shape: not taller-than-wide (0)  Margins: ill-defined (0)  Echogenic foci: none (0)  ACR TI-RADS total points: 4.  ACR TI-RADS risk category: TR4 (4-6 points).  ACR TI-RADS recommendations:  *Given size (>/= 1 - 1.4 cm) and appearance, a follow-up ultrasound in 1 year should be considered based on TI-RADS criteria.  _________________________________________________________  Nodule # 4:  Location: Left; Inferior  Maximum size: 1.0 cm; Other 2 dimensions: 0.7 x 0.7 cm  Composition: solid/almost completely solid (2)  Echogenicity: isoechoic (1)  Shape: not taller-than-wide (0)  Margins: ill-defined (0)  Echogenic foci: none (0)  ACR TI-RADS total points: 3.  ACR TI-RADS risk category: TR3 (3 points).  ACR TI-RADS recommendations:  Given size (<1.4 cm) and appearance, this nodule does NOT meet TI-RADS criteria for biopsy or dedicated follow-up.  _________________________________________________________  IMPRESSION: 1. Findings suggestive of multinodular goiter. 2. Nodule #2 within the right lobe of the thyroid, potentially correlating with the patient's palpable area of concern, meets imaging criteria to recommend percutaneous sampling as clinically indicated. 3. Nodules #1 and #3 both meet imaging criteria to recommend a 1 year follow-up.  The above is in keeping with the ACR TI-RADS recommendations - J Am Coll Radiol 2017;14:587-595.   Electronically Signed   By: Sandi Mariscal M.D.   On: 01/14/2019 11:21    Orders Placed This Encounter  Procedures  . Flu Vaccine QUAD High Dose(Fluad)  . Ambulatory referral to General Surgery    Gross side effects, risk and benefits, and alternatives of medications and treatment plan in general discussed with patient.  Patient is aware that all medications have potential side effects and we are unable to predict every side effect or drug-drug interaction that  may occur.   Patient will call with any questions prior to using medication if they have concerns.    Expresses verbal understanding and consents to current therapy and treatment regimen.  No barriers to understanding were identified.  Red flag symptoms and signs discussed in detail.  Patient expressed understanding regarding what to do in case of emergency\urgent symptoms  Please see AVS handed out to patient at the end of our visit for further patient instructions/ counseling done pertaining to today's office visit.   Return for 3-1/2 months for fasting lipid profile and CMP with OV 3 days later.     Note:  This note was prepared with assistance of Dragon voice recognition software. Occasional wrong-word or sound-a-like substitutions may have occurred due to the inherent limitations of voice recognition software.   This document serves as a record of services personally performed by Mellody Dance, DO. It was created on her behalf by Toni Amend, a trained medical scribe. The creation of this record is based on the scribe's personal observations and the provider's statements to them.   I have reviewed the above medical documentation for accuracy and completeness and I concur.  Mellody Dance, DO 01/18/2019 12:56 PM        --------------------------------------------------------------------------------------------------------------------------------------------------------------------------------------------------------------------------------------------    Subjective:     HPI: Pamela Reynolds is a 63 y.o. female who presents to [place] [facility] today for issues as discussed below.  Thyroid Nodules States she read about her thyroid scan on MyChart, but notes she does not have the medical knowledge or expertise to decipher her results.  Notes that she has been experiencing a fullness in the area for 4-6 months, and  has difficulty swallowing.  Says  sometimes her voice gets squeaky and high.  These are all new changes.  States she also "wants to clear her throat a lot," and feels something is "in there."  Says at first, she "couldn't swallow as well," and then it kept getting worse, and the area "got bigger."  States the area is still uncomfortable.  States "very uncomfortable since the ultrasound."     Wt Readings from Last 3 Encounters:  01/18/19 178 lb 14.4 oz (81.1 kg)  12/27/18 182 lb (82.6 kg)  04/15/18 184 lb (83.5 kg)   BP Readings from Last 3 Encounters:  01/18/19 125/74  12/27/18 129/75  04/15/18 134/82   Pulse Readings from Last 3 Encounters:  01/18/19 (!) 54  12/27/18 60  04/15/18 62   BMI Readings from Last 3 Encounters:  01/18/19 27.20 kg/m  12/27/18 26.88 kg/m  04/15/18 29.70 kg/m     Patient Care Team    Relationship Specialty Notifications Start End  Mellody Dance, DO PCP - General Family Medicine  10/18/15   Huel Cote, NP Nurse Practitioner Obstetrics and Gynecology  12/27/18      Patient Active Problem List   Diagnosis Date Noted  . Osteopenia 02/2016    Priority: Medium  . Thyroid nodule 01/18/2019  . Dysphagia 01/18/2019  . Hoarseness or changing voice 01/18/2019  . Medication management 04/19/2018  . Glucose intolerance (impaired glucose tolerance) 11/04/2015  . Possible exposure to STD 10/21/2015  . Persistent cough 10/21/2015  . Overweight (BMI 25.0-29.9) 10/21/2015  . Shortness of breath 06/21/2015  . FH: celiac disease 12/18/2012  . Weight gain 12/18/2012  . Anxiety 08/11/2010  . Essential hypertension 06/06/2010    Past Medical history, Surgical history, Family history, Social history, Allergies and Medications have been entered into the medical record, reviewed and changed as needed.    Current Meds  Medication Sig  . atorvastatin (LIPITOR) 20 MG tablet Take 1 tablet (20 mg total) by mouth daily.  . calcium-vitamin D (OSCAL WITH D) 500-200 MG-UNIT tablet Take 1  tablet by mouth.  . metoprolol tartrate (LOPRESSOR) 50 MG tablet Take 1 tablet (50 mg total) by mouth 2 (two) times daily.  Marland Kitchen spironolactone (ALDACTONE) 25 MG tablet Take 1 tablet (25 mg total) by mouth daily.  . valsartan-hydrochlorothiazide (DIOVAN-HCT) 160-25 MG tablet Take 1 tablet by mouth daily.    Allergies:  Allergies  Allergen Reactions  . Amlodipine Besylate     REACTION: edema and rash on bilateral ankles  . Lisinopril     REACTION: presumed ACE cough     Review of Systems:  A fourteen system review of systems was performed and found to be positive as per HPI.   Objective:   Blood pressure 125/74, pulse (!) 54, resp. rate 16, height 5\' 8"  (1.727 m), weight 178 lb 14.4 oz (81.1 kg), SpO2 100 %. Body mass index is 27.2 kg/m. General:  Well Developed, well nourished, appropriate for stated age.  Neuro:  Alert and oriented,  extra-ocular muscles intact  HEENT:  Normocephalic, atraumatic, neck supple, no carotid bruits appreciated  Skin:  no gross rash, warm, pink. Cardiac:  RRR, S1 S2 Respiratory:  ECTA B/L and A/P, Not using accessory muscles, speaking in full sentences- unlabored. Vascular:  Ext warm, no cyanosis apprec.; cap RF less 2 sec. Psych:  No HI/SI, judgement and insight good, Euthymic mood. Full Affect.

## 2019-01-20 DIAGNOSIS — E041 Nontoxic single thyroid nodule: Secondary | ICD-10-CM | POA: Diagnosis not present

## 2019-02-07 DIAGNOSIS — Z20828 Contact with and (suspected) exposure to other viral communicable diseases: Secondary | ICD-10-CM | POA: Diagnosis not present

## 2019-02-07 DIAGNOSIS — Z01812 Encounter for preprocedural laboratory examination: Secondary | ICD-10-CM | POA: Diagnosis not present

## 2019-02-07 DIAGNOSIS — E041 Nontoxic single thyroid nodule: Secondary | ICD-10-CM | POA: Diagnosis not present

## 2019-02-10 DIAGNOSIS — E041 Nontoxic single thyroid nodule: Secondary | ICD-10-CM | POA: Diagnosis not present

## 2019-02-13 DIAGNOSIS — C73 Malignant neoplasm of thyroid gland: Secondary | ICD-10-CM | POA: Diagnosis not present

## 2019-02-13 DIAGNOSIS — E041 Nontoxic single thyroid nodule: Secondary | ICD-10-CM | POA: Diagnosis not present

## 2019-02-15 ENCOUNTER — Encounter: Payer: Self-pay | Admitting: Gynecology

## 2019-03-02 DIAGNOSIS — E890 Postprocedural hypothyroidism: Secondary | ICD-10-CM | POA: Diagnosis not present

## 2019-03-02 DIAGNOSIS — Z4889 Encounter for other specified surgical aftercare: Secondary | ICD-10-CM | POA: Diagnosis not present

## 2019-03-03 DIAGNOSIS — H524 Presbyopia: Secondary | ICD-10-CM | POA: Diagnosis not present

## 2019-03-03 DIAGNOSIS — H02831 Dermatochalasis of right upper eyelid: Secondary | ICD-10-CM | POA: Diagnosis not present

## 2019-03-03 DIAGNOSIS — H2513 Age-related nuclear cataract, bilateral: Secondary | ICD-10-CM | POA: Diagnosis not present

## 2019-03-03 DIAGNOSIS — H5213 Myopia, bilateral: Secondary | ICD-10-CM | POA: Diagnosis not present

## 2019-03-17 ENCOUNTER — Ambulatory Visit
Admission: RE | Admit: 2019-03-17 | Discharge: 2019-03-17 | Disposition: A | Discharge status: Home / Self Care | Payer: Medicare Other | Source: Ambulatory Visit | Attending: Family Medicine | Admitting: Family Medicine

## 2019-03-17 ENCOUNTER — Other Ambulatory Visit: Payer: Self-pay

## 2019-03-17 DIAGNOSIS — Z1239 Encounter for other screening for malignant neoplasm of breast: Secondary | ICD-10-CM

## 2019-03-17 DIAGNOSIS — Z780 Asymptomatic menopausal state: Secondary | ICD-10-CM | POA: Diagnosis not present

## 2019-03-17 DIAGNOSIS — Z13820 Encounter for screening for osteoporosis: Secondary | ICD-10-CM

## 2019-03-17 DIAGNOSIS — M85852 Other specified disorders of bone density and structure, left thigh: Secondary | ICD-10-CM | POA: Diagnosis not present

## 2019-03-17 DIAGNOSIS — M8580 Other specified disorders of bone density and structure, unspecified site: Secondary | ICD-10-CM

## 2019-03-17 DIAGNOSIS — Z0000 Encounter for general adult medical examination without abnormal findings: Secondary | ICD-10-CM

## 2019-03-17 DIAGNOSIS — Z1231 Encounter for screening mammogram for malignant neoplasm of breast: Secondary | ICD-10-CM | POA: Diagnosis not present

## 2019-03-27 ENCOUNTER — Other Ambulatory Visit: Payer: Self-pay | Admitting: Family Medicine

## 2019-03-27 DIAGNOSIS — Z79899 Other long term (current) drug therapy: Secondary | ICD-10-CM

## 2019-04-20 ENCOUNTER — Other Ambulatory Visit: Payer: Self-pay

## 2019-04-20 ENCOUNTER — Other Ambulatory Visit: Payer: Medicare Other

## 2019-04-20 ENCOUNTER — Telehealth: Payer: Self-pay | Admitting: Family Medicine

## 2019-04-20 DIAGNOSIS — E041 Nontoxic single thyroid nodule: Secondary | ICD-10-CM

## 2019-04-20 DIAGNOSIS — E785 Hyperlipidemia, unspecified: Secondary | ICD-10-CM

## 2019-04-20 MED ORDER — SHINGRIX 50 MCG/0.5ML IM SUSR: 0.5000 mL | Freq: Once | INTRAMUSCULAR | 0 refills | Status: AC

## 2019-04-20 NOTE — Telephone Encounter (Signed)
Patient states she is due for her 2nd round of shingles vacc, she would like this order to go to [facility].

## 2019-04-20 NOTE — Addendum Note (Signed)
Addended by: Mickel Crow on: 04/20/2019 10:52 AM   Modules accepted: Orders

## 2019-04-20 NOTE — Telephone Encounter (Signed)
Left message for patient to notify shingles vaccine ordered and sent to pharmacy. AS, CMA

## 2019-04-21 LAB — COMPREHENSIVE METABOLIC PANEL
ALT: 16 IU/L (ref 0–32)
AST: 24 IU/L (ref 0–40)
Albumin/Globulin Ratio: 1.8 (ref 1.2–2.2)
Albumin: 4.2 g/dL (ref 3.8–4.8)
Alkaline Phosphatase: 121 IU/L — ABNORMAL HIGH (ref 39–117)
BUN/Creatinine Ratio: 19 (ref 12–28)
BUN: 21 mg/dL (ref 8–27)
Bilirubin Total: 0.6 mg/dL (ref 0.0–1.2)
CO2: 20 mmol/L (ref 20–29)
Calcium: 9.3 mg/dL (ref 8.7–10.3)
Chloride: 101 mmol/L (ref 96–106)
Creatinine, Ser: 1.11 mg/dL — ABNORMAL HIGH (ref 0.57–1.00)
GFR calc Af Amer: 60 mL/min/{1.73_m2} (ref >59)
GFR calc non Af Amer: 52 mL/min/{1.73_m2} — ABNORMAL LOW (ref >59)
Globulin, Total: 2.3 g/dL (ref 1.5–4.5)
Glucose: 86 mg/dL (ref 65–99)
Potassium: 5 mmol/L (ref 3.5–5.2)
Sodium: 141 mmol/L (ref 134–144)
Total Protein: 6.5 g/dL (ref 6.0–8.5)

## 2019-04-21 LAB — LIPID PANEL
Chol/HDL Ratio: 2.9 ratio (ref 0.0–4.4)
Cholesterol, Total: 135 mg/dL (ref 100–199)
HDL: 46 mg/dL (ref >39)
LDL Chol Calc (NIH): 75 mg/dL (ref 0–99)
Triglycerides: 68 mg/dL (ref 0–149)
VLDL Cholesterol Cal: 14 mg/dL (ref 5–40)

## 2019-04-21 LAB — TSH: TSH: 7.4 u[IU]/mL — ABNORMAL HIGH (ref 0.450–4.500)

## 2019-04-21 LAB — T4, FREE: Free T4: 1.04 ng/dL (ref 0.82–1.77)

## 2019-04-27 ENCOUNTER — Other Ambulatory Visit: Payer: Medicare Other

## 2019-05-01 ENCOUNTER — Ambulatory Visit (INDEPENDENT_AMBULATORY_CARE_PROVIDER_SITE_OTHER): Payer: Medicare Other | Admitting: Family Medicine

## 2019-05-01 ENCOUNTER — Other Ambulatory Visit: Payer: Self-pay

## 2019-05-01 ENCOUNTER — Encounter: Payer: Self-pay | Admitting: Family Medicine

## 2019-05-01 VITALS — BP 136/83 | HR 64 | Temp 97.1°F | Wt 178.0 lb

## 2019-05-01 DIAGNOSIS — I10 Essential (primary) hypertension: Secondary | ICD-10-CM

## 2019-05-01 DIAGNOSIS — C73 Malignant neoplasm of thyroid gland: Secondary | ICD-10-CM

## 2019-05-01 DIAGNOSIS — E890 Postprocedural hypothyroidism: Secondary | ICD-10-CM | POA: Insufficient documentation

## 2019-05-01 DIAGNOSIS — Z9889 Other specified postprocedural states: Secondary | ICD-10-CM | POA: Diagnosis not present

## 2019-05-01 DIAGNOSIS — R7989 Other specified abnormal findings of blood chemistry: Secondary | ICD-10-CM | POA: Insufficient documentation

## 2019-05-01 DIAGNOSIS — Z79899 Other long term (current) drug therapy: Secondary | ICD-10-CM

## 2019-05-01 DIAGNOSIS — M8580 Other specified disorders of bone density and structure, unspecified site: Secondary | ICD-10-CM

## 2019-05-01 DIAGNOSIS — R7302 Impaired glucose tolerance (oral): Secondary | ICD-10-CM

## 2019-05-01 DIAGNOSIS — E785 Hyperlipidemia, unspecified: Secondary | ICD-10-CM | POA: Diagnosis not present

## 2019-05-01 DIAGNOSIS — E559 Vitamin D deficiency, unspecified: Secondary | ICD-10-CM

## 2019-05-01 HISTORY — DX: Malignant neoplasm of thyroid gland: C73

## 2019-05-01 NOTE — Progress Notes (Signed)
Virtual / live video office visit note for [employer], D.O- Primary Care Physician at [facility]   I connected with current patient today and beyond visually recognizing the correct individual, I verified that I am speaking with the correct person using two identifiers.  . Location of the patient: Home . Location of the provider: Office Only the patient (+/- their family members at pt's discretion) and myself were participating in the encounter    - This visit type was conducted due to national recommendations for restrictions regarding the COVID-19 Pandemic (e.g. social distancing) in an effort to limit this patient's exposure and mitigate transmission in our community.  This format is felt to be most appropriate for this patient at this time.   - The patient did have access to video technology today  - No physical exam could be performed with this format, beyond that communicated to [place] by the patient/ family members as noted.   - Additionally my office staff/ schedulers discussed with the patient that there may be a monetary charge related to this service, depending on patient's medical insurance.   The patient expressed understanding, and agreed to proceed.      History of Present Illness:   I, Toni Amend, am serving as scribe for Dr. Mellody Dance.    - Thyroid Nodule; Right Thyroid Lobectomy She follows up with Dr. Cam Hai of [facility] specializing in Endocrine d/o, who is her surgeon.  Had right lobe and isthmus removed in Oct 2020.  In terms of symptoms, says she has been exhausted.  Denies SOB or heart palpitations; states "I feel good in terms of that."  Says "I generally feel good; if I can get my thyroid under control and get through the renovations and holiday season, I'm sure I'll be fine, I don't have any concerns."   - Vitamin D Deficiency Notes she may have skipped a little of her Vitamin D supplementation recently.   - Kidney Health  Seldom takes Advil or alleve; takes calcium & Vitamin D.  Notes "I need to drink more water and exercise; I'm active all day, but I don't exercise."   HPI:  Hyperlipidemia: 63 y.o. female here for cholesterol follow-up.    Had hip pain recently after starting Lipitor, but this resolved after she had her mattress replaced.  Notes she thinks it was her bed and not the statin medication.  - Patient reports good compliance with treatment plan of:  medication and/ or lifestyle management.    - Patient denies any acute concerns or problems with management plan   - She denies new onset of: myalgias, arthralgias, increased fatigue more than normal, chest pains, exercise intolerance, shortness of breath, dizziness, visual changes, headache, lower extremity swelling or claudication.   Most recent cholesterol panel was:  Lab Results  Component Value Date   CHOL 135 04/20/2019   HDL 46 04/20/2019   LDLCALC 75 04/20/2019   LDLDIRECT 149.6 12/08/2012   TRIG 68 04/20/2019   CHOLHDL 2.9 04/20/2019   Hepatic Function Latest Ref Rng & Units 04/20/2019 12/27/2018 12/21/2015  Total Protein 6.0 - 8.5 g/dL 6.5 6.5 7.4  Albumin 3.8 - 4.8 g/dL 4.2 4.3 4.3  AST 0 - 40 IU/L 24 21 21   ALT 0 - 32 IU/L 16 16 18   Alk Phosphatase 39 - 117 IU/L 121(H) 91 80  Total Bilirubin 0.0 - 1.2 mg/dL 0.6 0.7 1.0  Bilirubin, Direct 0.1 - 0.5 mg/dL - - 0.1  HPI:  Hypertension:  -  Her blood pressure at home has been running: "in the 120-130 range as a rule, over roughly 75-80."  She checks a couple of times per week and notes "always under 140, closer to 130."  - Patient reports good compliance with medication and/or lifestyle modification  - Her denies acute concerns or problems related to treatment plan  - She denies new onset of: chest pain, exercise intolerance, shortness of breath, dizziness, visual changes, headache, lower extremity swelling or claudication.   Last 3 blood pressure readings in our office are  as follows: BP Readings from Last 3 Encounters:  05/01/19 136/83  01/18/19 125/74  12/27/18 129/75   Filed Weights   05/01/19 1308  Weight: 178 lb (80.7 kg)        Depression screen [facility] 2/9 01/18/2019 12/27/2018 04/15/2018 11/01/2015 10/18/2015  Decreased Interest 0 0 0 0 0  Down, Depressed, Hopeless 0 0 0 0 0  PHQ - 2 Score 0 0 0 0 0  Altered sleeping 0 1 0 - -  Tired, decreased energy 1 1 0 - -  Change in appetite 0 0 0 - -  Feeling bad or failure about yourself  0 0 0 - -  Trouble concentrating 1 0 0 - -  Moving slowly or fidgety/restless 0 0 0 - -  Suicidal thoughts 0 0 0 - -  PHQ-9 Score 2 2 0 - -  Difficult doing work/chores Not difficult at all Somewhat difficult Not difficult at all - -       Impression and Recommendations:    1. Papillary thyroid carcinoma ([place])   2. Status post subtotal thyroidectomy-  R lobe and isthmus   3. Elevated TSH   4. Hyperlipidemia, unspecified hyperlipidemia type   5. Essential hypertension   6. Elevated serum creatinine   7. Glucose intolerance (impaired glucose tolerance)   8. Vitamin D deficiency   9. Osteopenia, unspecified location   10. Medication management     - Reviewed recent lab work (04/20/2019) in depth with patient today.  All lab work within normal limits unless otherwise noted.  Extensive education provided and all questions answered.   Vitamin D Deficiency - h/o Osteopenia - 41.9 last check 4 mo ago, up from 36 prior. -However patient has been off her vitamin D supplements as she lost them in the move and just remember taking them about 2 weeks ago.  This is probably why her alkaline phosphatase is slightly up due to a lower vitamin D.  We will continue to monitor. - Discussed goal of 40-60 range. - Continue supplementation as established.  See med list. - Will continue to monitor and re-check as discussed.   Glucose Intolerance - A1c was 5.7 last check 4 months ago. - Counseled patient on prevention of  disease and discussed lifestyle modification as first line.  - Importance of low carb, heart-healthy diet discussed with patient in addition to regular aerobic exercise of 43mn 5d/week or more.   - Will continue to monitor and re-check as discussed.   Alkaline Phosphatase Slightly Elevated - 121 last check, up from 91 prior. - Discussed that replacing Vitamin D may help this to resolve.  - Will continue to monitor.   Elevated Serum Creatinine- stage 3 ckd - Serum creatinine = elevated, up to 1.11 from 1.08 prior. - GFR = 52 low, down from 54 prior.  - To help preserve and improve kidney health, advised patient to maintain a blood pressure within  normal limits, maintain control over blood sugar, hydrate adequately, and engage in regular physical activity.  - Patient will continue to avoid use of nephrotoxic substances.  - Will continue to monitor and re-check in four months as discussed.  - In four months, if kidney function remains elevated / serum creatinine up despite controlled blood pressure, we will obtain a urine micro albumin as well as a renal ultrasound.  Concern with her history of papillary carcinoma of the thyroid there may be some connection.   Papillary Thyroid Carcinoma - Right Lobectomy and Isthmus removed 02/13/2019 - Elevated TSH = 7.400, elevated, up from 1.980 prior. - Patient is following up with thyroid surgeon Dr. Scarlette Slice. - Per patient, will continue to consult with Dr. Scarlette Slice.  - Will continue to monitor alongside Dr. Scarlette Slice of endocrinology.   Hyperlipidemia - Managed on Lipitor.   Last FLP: - Triglycerides = 68, down from 84 prior. - HDL = 46, up from 44 prior. - LDL = 75, down from 123 prior  - Cholesterol levels are at goal. - Pt will continue current treatment regimen.  See med list.  - Dietary changes such as low saturated & trans fat diets for hyperlipidemia and low carb/ ketogenic diets for hypertriglyceridemia discussed with patient.    -  Encouraged patient to follow AHA guidelines for regular exercise and also engage in weight loss if BMI above 25.   - We will continue to monitor and re-check as discussed.   Essential Hypertension - Blood pressure currently is at goal. - Patient will continue current treatment regimen.  See med list.  - Counseled patient on pathophysiology of disease and discussed various treatment options, which always includes dietary and lifestyle modification as first line.   - Lifestyle changes such as dash and heart healthy diets and engaging in a regular exercise program discussed extensively with patient.   - Ambulatory blood pressure monitoring encouraged at least 3 times weekly.  Keep log and bring in every office visit.  Reminded patient that if they ever feel poorly in any way, to check their blood pressure and pulse.  Patient knows to watch for elevated blood pressure.  - Handouts provided at patient's desire and/or told to go online at the [place] website for further information  - We will continue to monitor   Counseling for Health Promotion and Disease Prevention - Advised patient to continue working toward exercising to improve overall mental, physical, and emotional health.   - Encouraged patient to engage in daily physical activity, especially a formal exercise routine.  Recommended that the patient eventually strive for at least 150 minutes of moderate cardiovascular activity per week according to guidelines established by the [facility].   - Healthy dietary habits encouraged, including low-carb, and high amounts of lean protein in diet.   - Patient should also consume adequate amounts of water.  - Health counseling performed.  All questions answered.   Recommendations - Re-check kidney function in 4 months.   - As part of my medical decision making, I reviewed the following data within the [place] History obtained from pt /family, CMA notes  reviewed and incorporated if applicable, Labs reviewed, Radiograph/ tests reviewed if applicable and OV notes from prior OV's with me, as well as other specialists she/he has seen since seeing me last, were all reviewed and used in my medical decision making process today.   - The patient agreed with the plan and demonstrated an understanding of the instructions.  No barriers to understanding were identified.     Return for lab work in 4 months, re-check kidney funciton.    Note:  This note was prepared with assistance of Dragon voice recognition software. Occasional wrong-word or sound-a-like substitutions may have occurred due to the inherent limitations of voice recognition software.   This document serves as a record of services personally performed by Mellody Dance, DO. It was created on her behalf by Toni Amend, a trained medical scribe. The creation of this record is based on the scribe's personal observations and the provider's statements to them.   This case required medical decision making of at least moderate complexity. The above documentation has been reviewed to be accurate and was completed by Marjory Sneddon, D.O.       Patient Care Team    Relationship Specialty Notifications Start End  Mellody Dance, DO PCP - General Family Medicine  10/18/15   Huel Cote, NP Nurse Practitioner Obstetrics and Gynecology  12/27/18   Sula Rumple, MD Referring Physician General Surgery  04/24/19    Comment: thryoid CA surgeon  Tsamis, Constance Haw, MD Referring Physician Ophthalmology  04/24/19     -Vitals obtained; medications/ allergies reconciled;  personal medical, social, Sx etc.histories were updated by CMA, reviewed by me and are reflected in chart  Patient Active Problem List   Diagnosis Date Noted  . Elevated serum creatinine 05/01/2019  . Hyperlipidemia 05/01/2019  . Status post subtotal thyroidectomy-  R lobe and isthmus 05/01/2019  .  Papillary thyroid carcinoma ([place]) 05/01/2019  . Essential hypertension 06/06/2010  . Osteopenia 02/2016  . Glucose intolerance (impaired glucose tolerance) 11/04/2015  . Thyroid nodule 01/18/2019  . Dysphagia 01/18/2019  . Hoarseness or changing voice 01/18/2019  . Medication management 04/19/2018  . Possible exposure to STD 10/21/2015  . Persistent cough 10/21/2015  . Overweight (BMI 25.0-29.9) 10/21/2015  . Shortness of breath 06/21/2015  . FH: celiac disease 12/18/2012  . Weight gain 12/18/2012  . Anxiety 08/11/2010     Current Meds  Medication Sig  . atorvastatin (LIPITOR) 20 MG tablet Take 1 tablet (20 mg total) by mouth daily.  . calcium-vitamin D (OSCAL WITH D) 500-200 MG-UNIT tablet Take 1 tablet by mouth.  . metoprolol tartrate (LOPRESSOR) 50 MG tablet TAKE 1 TABLET BY MOUTH 2 TIMES DAILY.  Marland Kitchen spironolactone (ALDACTONE) 25 MG tablet TAKE 1 TABLET BY MOUTH DAILY.  . valsartan-hydrochlorothiazide (DIOVAN-HCT) 160-25 MG tablet TAKE 1 TABLET BY MOUTH DAILY.     Allergies  Allergen Reactions  . Amlodipine Besylate     REACTION: edema and rash on bilateral ankles  . Lisinopril     REACTION: presumed ACE cough     ROS:  See above HPI for pertinent positives and negatives   Objective:   Blood pressure 136/83, pulse 64, temperature (!) 97.1 F (36.2 C), temperature source Oral, weight 178 lb (80.7 kg).  (if some vitals are omitted, this means that patient was UNABLE to obtain them even though they were asked to get them prior to OV today.  They were asked to call us at their earliest convenience with these once obtained.)  General: A & O * 3; visually in no acute distress; in usual state of health.  Skin: Visible skin appears normal and pt's usual skin color HEENT:  EOMI, head is normocephalic and atraumatic.  Sclera are anicteric. Neck has a good range of motion.  Lips are noncyanotic Chest: normal chest excursion and movement Respiratory: speaking in  full sentences,  no conversational dyspnea; no use of accessory muscles Psych: insight good, mood- appears full

## 2019-05-19 DIAGNOSIS — Z79899 Other long term (current) drug therapy: Secondary | ICD-10-CM | POA: Diagnosis not present

## 2019-05-19 DIAGNOSIS — Z923 Personal history of irradiation: Secondary | ICD-10-CM | POA: Diagnosis not present

## 2019-05-19 DIAGNOSIS — Z9089 Acquired absence of other organs: Secondary | ICD-10-CM | POA: Diagnosis not present

## 2019-05-19 DIAGNOSIS — E039 Hypothyroidism, unspecified: Secondary | ICD-10-CM | POA: Diagnosis not present

## 2019-05-19 DIAGNOSIS — E890 Postprocedural hypothyroidism: Secondary | ICD-10-CM | POA: Diagnosis not present

## 2019-05-19 DIAGNOSIS — K5900 Constipation, unspecified: Secondary | ICD-10-CM | POA: Diagnosis not present

## 2019-05-19 DIAGNOSIS — Z9009 Acquired absence of other part of head and neck: Secondary | ICD-10-CM | POA: Diagnosis not present

## 2019-05-19 DIAGNOSIS — C73 Malignant neoplasm of thyroid gland: Secondary | ICD-10-CM | POA: Diagnosis not present

## 2019-06-15 DIAGNOSIS — Z79899 Other long term (current) drug therapy: Secondary | ICD-10-CM | POA: Diagnosis not present

## 2019-06-15 DIAGNOSIS — J309 Allergic rhinitis, unspecified: Secondary | ICD-10-CM | POA: Diagnosis not present

## 2019-06-15 DIAGNOSIS — J343 Hypertrophy of nasal turbinates: Secondary | ICD-10-CM | POA: Diagnosis not present

## 2019-06-15 DIAGNOSIS — J300 Vasomotor rhinitis: Secondary | ICD-10-CM | POA: Diagnosis not present

## 2019-06-15 DIAGNOSIS — R6889 Other general symptoms and signs: Secondary | ICD-10-CM | POA: Diagnosis not present

## 2019-06-15 DIAGNOSIS — J385 Laryngeal spasm: Secondary | ICD-10-CM | POA: Diagnosis not present

## 2019-06-15 DIAGNOSIS — R490 Dysphonia: Secondary | ICD-10-CM | POA: Diagnosis not present

## 2019-06-15 DIAGNOSIS — J383 Other diseases of vocal cords: Secondary | ICD-10-CM | POA: Diagnosis not present

## 2019-06-15 DIAGNOSIS — H9190 Unspecified hearing loss, unspecified ear: Secondary | ICD-10-CM | POA: Diagnosis not present

## 2019-07-03 ENCOUNTER — Other Ambulatory Visit: Payer: Self-pay | Admitting: Family Medicine

## 2019-07-17 ENCOUNTER — Other Ambulatory Visit: Payer: Self-pay | Admitting: Family Medicine

## 2019-07-17 DIAGNOSIS — Z79899 Other long term (current) drug therapy: Secondary | ICD-10-CM

## 2019-10-05 ENCOUNTER — Telehealth: Payer: Self-pay | Admitting: Physician Assistant

## 2019-10-05 ENCOUNTER — Other Ambulatory Visit: Payer: Self-pay | Admitting: Physician Assistant

## 2019-10-05 DIAGNOSIS — Z79899 Other long term (current) drug therapy: Secondary | ICD-10-CM

## 2019-10-05 NOTE — Telephone Encounter (Signed)
Please advise patient that 60 day RXs sent to pharmacy.  She was supposed to have returned in April.  Therefore, she will need OV prior to any further refills

## 2019-10-05 NOTE — Telephone Encounter (Signed)
Patient called states she needs refills on 3 diff meds :   1)--- metoprolol tartrate (LOPRESSOR) 50 MG tablet [id number]   Order Details Dose, Route, Frequency: As Directed  Dispense Quantity: 180 tablet Refills: 0       Sig: TAKE 1 TABLET BY MOUTH 2 TIMES DAILY.        2)---- spironolactone (ALDACTONE) 25 MG tablet [id number]   Order Details Dose, Route, Frequency: As Directed  Dispense Quantity: 90 tablet Refills: 0       Sig: TAKE 1 TABLET BY MOUTH DAILY.    3)---- valsartan-hydrochlorothiazide (DIOVAN-HCT) 160-25 MG tablet [id number]   Order Details Dose: 1 tablet Route: Oral Frequency: Daily  Dispense Quantity: 90 tablet Refills: 0       Sig: TAKE 1 TABLET BY MOUTH DAILY.    ----Forwarding request to med asst that if approved send refill order to :   [place], [place] - [address] [phone] (Phone) [phone] (Fax)   --glh

## 2020-02-08 IMAGING — MG DIGITAL SCREENING BILAT W/ TOMO W/ CAD
8 series · 8 of 24 positions shown · non-contrast
Comparison: Previous exam(s).

CLINICAL DATA: Screening.

EXAM:
DIGITAL SCREENING BILATERAL MAMMOGRAM WITH TOMO AND CAD

[L MLO synth-2D]
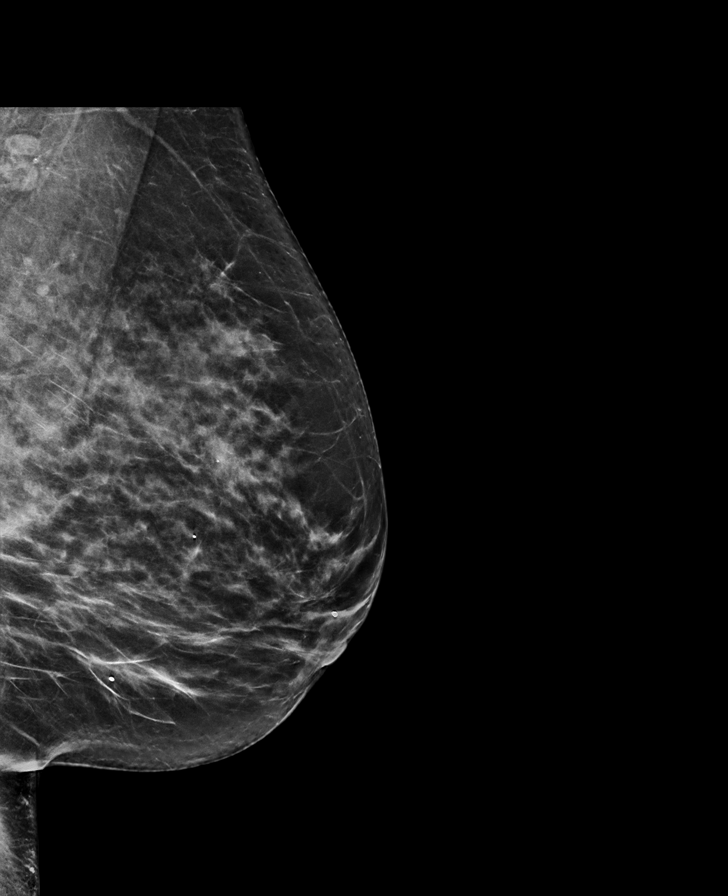

[L CC synth-2D]
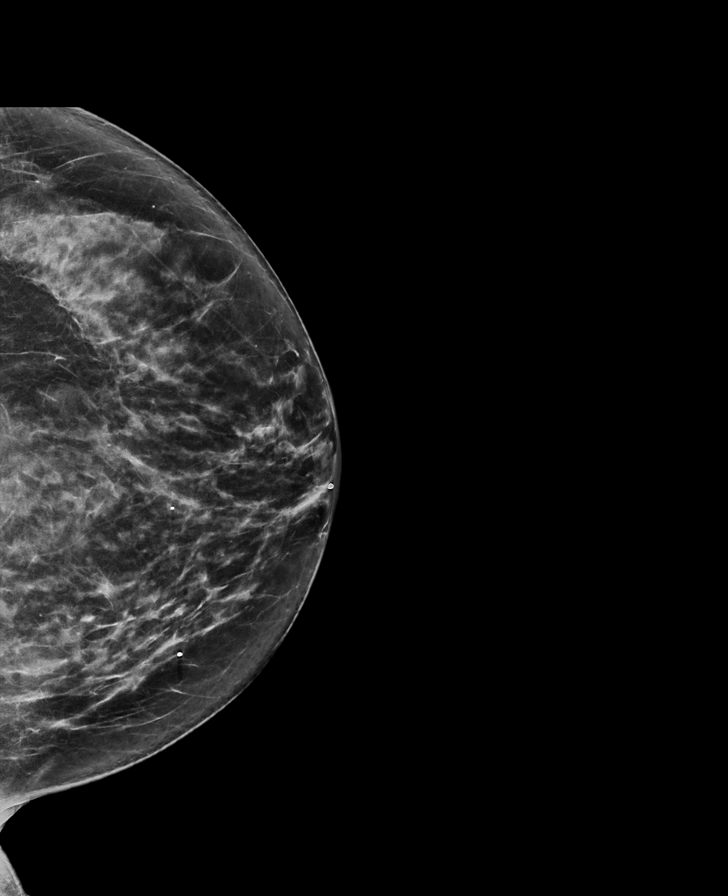

[R CC synth-2D]
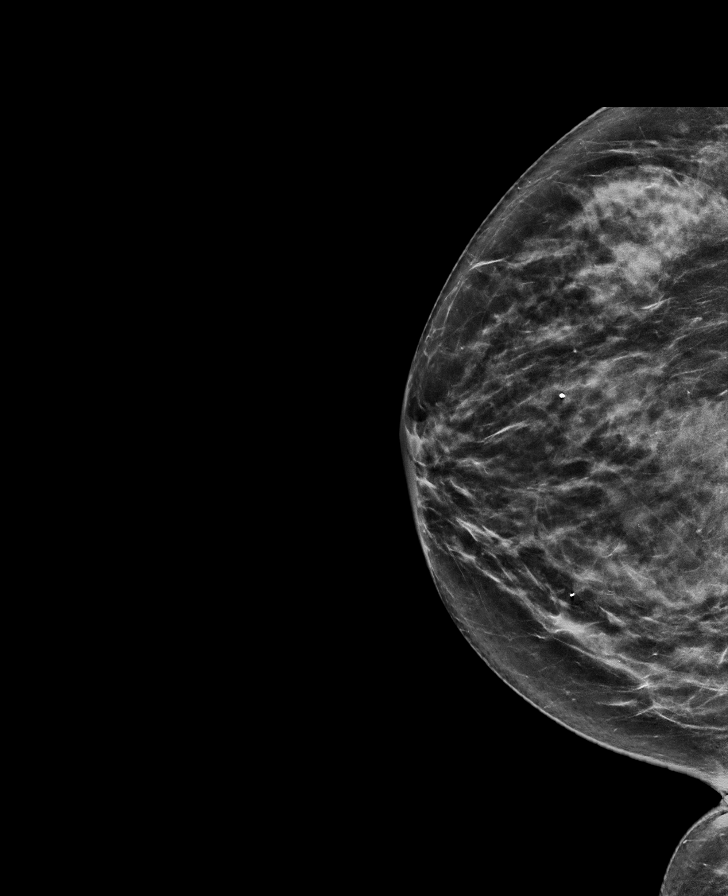

[R MLO synth-2D]
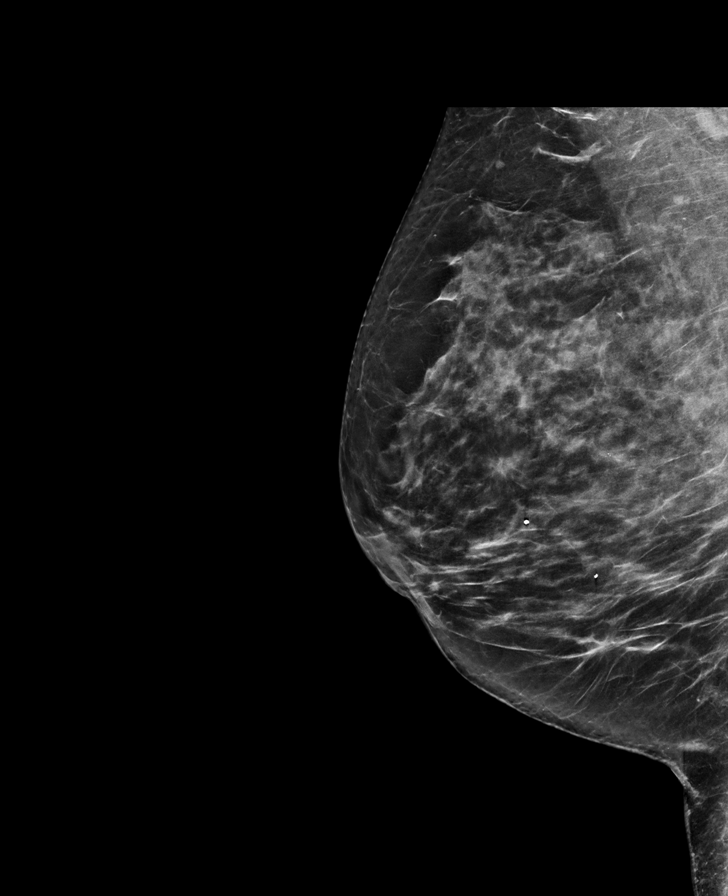

[R MLO tomo · tomo slice 37/73.0]
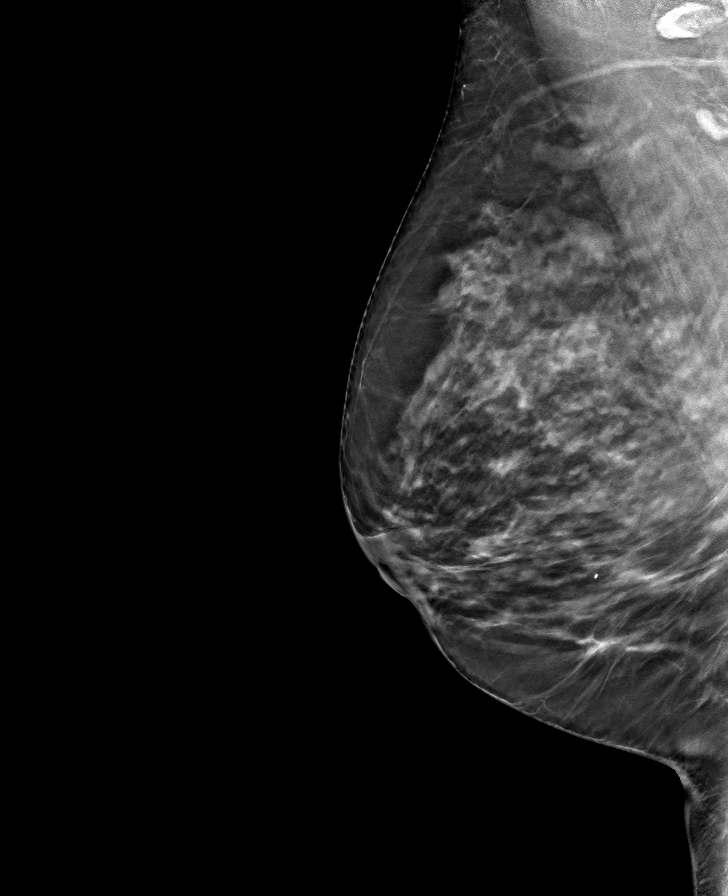

[L MLO tomo · tomo slice 37/72.0]
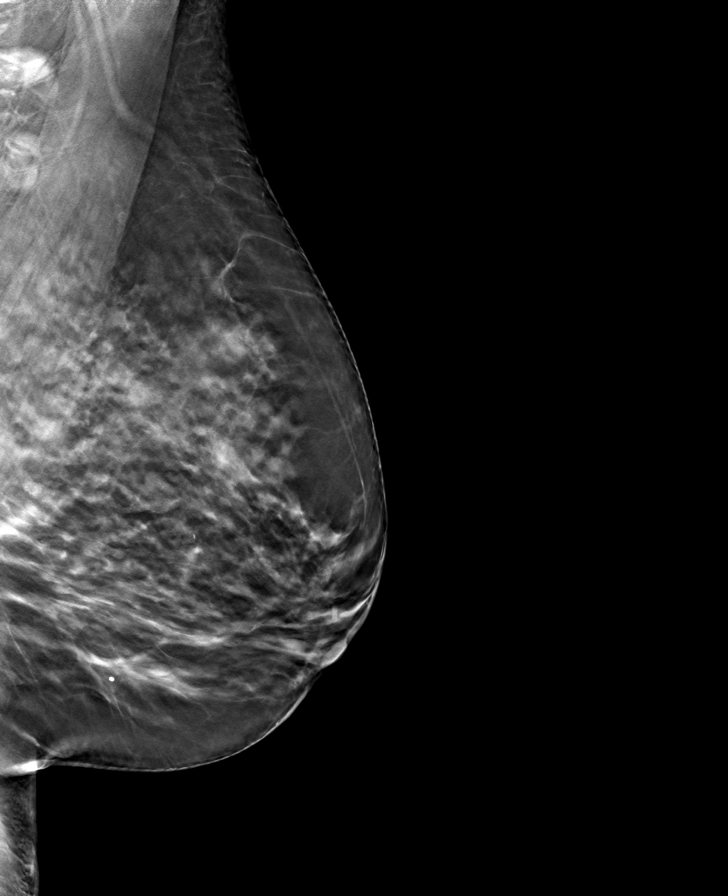

[R CC tomo · tomo slice 37/72.0]
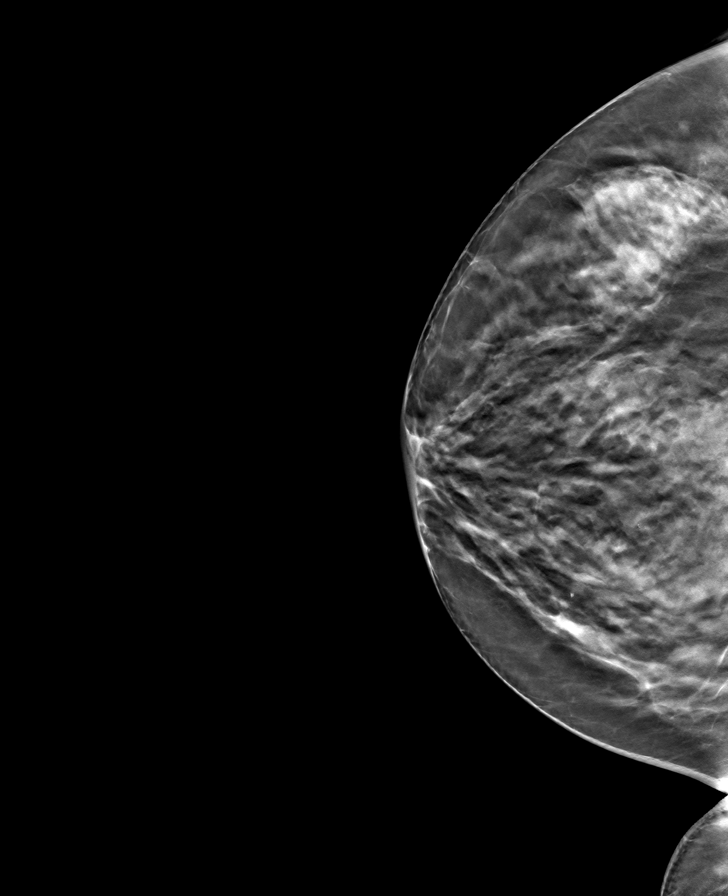

[L CC tomo · tomo slice 35/69.0]
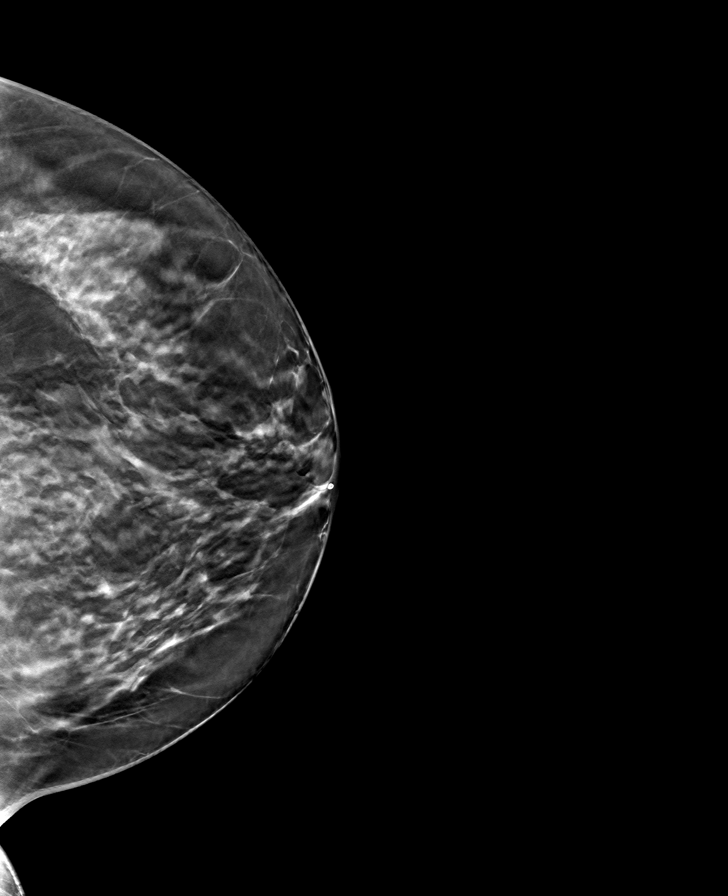

[8 of 24 positions shown; findings below may reference images not displayed]

ACR Breast Density Category c: The breast tissue is heterogeneously
dense, which may obscure small masses.
FINDINGS: There are no findings suspicious for malignancy. Images were
processed with CAD.
IMPRESSION: No mammographic evidence of malignancy. A result letter of this
screening mammogram will be mailed directly to the patient.

RECOMMENDATION:
Screening mammogram in one year. (Code:[id number]-[id number])

BI-RADS CATEGORY  1: Negative.
